# Patient Record
Sex: Female | Born: 1980 | Race: Black or African American | Hispanic: No | Marital: Single | Smoking: Current every day smoker
Health system: Southern US, Community
[De-identification: ages and names within clinical notes are randomized; demographics above are authoritative.]

## PROBLEM LIST (undated history)

## (undated) DIAGNOSIS — F419 Anxiety disorder, unspecified: Secondary | ICD-10-CM

## (undated) DIAGNOSIS — M549 Dorsalgia, unspecified: Secondary | ICD-10-CM

## (undated) DIAGNOSIS — G8929 Other chronic pain: Secondary | ICD-10-CM

## (undated) HISTORY — PX: WISDOM TOOTH EXTRACTION: SHX21

---

## 2004-10-23 ENCOUNTER — Emergency Department (HOSPITAL_COMMUNITY): Admission: EM | Admit: 2004-10-23 | Discharge: 2004-10-23 | Payer: Self-pay | Admitting: Emergency Medicine

## 2007-05-07 ENCOUNTER — Emergency Department (HOSPITAL_COMMUNITY): Admission: EM | Admit: 2007-05-07 | Discharge: 2007-05-07 | Payer: Self-pay | Admitting: Emergency Medicine

## 2007-11-15 ENCOUNTER — Emergency Department (HOSPITAL_COMMUNITY): Admission: EM | Admit: 2007-11-15 | Discharge: 2007-11-15 | Payer: Self-pay | Admitting: Emergency Medicine

## 2008-03-06 ENCOUNTER — Emergency Department (HOSPITAL_BASED_OUTPATIENT_CLINIC_OR_DEPARTMENT_OTHER): Admission: EM | Admit: 2008-03-06 | Discharge: 2008-03-06 | Payer: Self-pay | Admitting: Emergency Medicine

## 2008-03-13 ENCOUNTER — Inpatient Hospital Stay (HOSPITAL_COMMUNITY): Admission: AD | Admit: 2008-03-13 | Discharge: 2008-03-14 | Payer: Self-pay | Admitting: Obstetrics and Gynecology

## 2009-06-30 ENCOUNTER — Emergency Department (HOSPITAL_COMMUNITY): Admission: EM | Admit: 2009-06-30 | Discharge: 2009-07-01 | Payer: Self-pay | Admitting: Emergency Medicine

## 2009-10-11 ENCOUNTER — Emergency Department (HOSPITAL_BASED_OUTPATIENT_CLINIC_OR_DEPARTMENT_OTHER): Admission: EM | Admit: 2009-10-11 | Discharge: 2009-10-11 | Payer: Self-pay | Admitting: Emergency Medicine

## 2009-12-03 ENCOUNTER — Emergency Department (HOSPITAL_COMMUNITY): Admission: EM | Admit: 2009-12-03 | Discharge: 2009-12-03 | Payer: Self-pay | Admitting: Emergency Medicine

## 2010-05-18 ENCOUNTER — Emergency Department (HOSPITAL_COMMUNITY): Admission: EM | Admit: 2010-05-18 | Discharge: 2010-05-18 | Payer: Self-pay | Admitting: Emergency Medicine

## 2010-10-26 ENCOUNTER — Emergency Department (HOSPITAL_COMMUNITY)
Admission: EM | Admit: 2010-10-26 | Discharge: 2010-10-26 | Disposition: A | Discharge status: Home / Self Care | Payer: Self-pay | Attending: Emergency Medicine | Admitting: Emergency Medicine

## 2010-10-26 DIAGNOSIS — Y92009 Unspecified place in unspecified non-institutional (private) residence as the place of occurrence of the external cause: Secondary | ICD-10-CM | POA: Insufficient documentation

## 2010-10-26 DIAGNOSIS — M545 Low back pain, unspecified: Secondary | ICD-10-CM | POA: Insufficient documentation

## 2010-10-26 DIAGNOSIS — S335XXA Sprain of ligaments of lumbar spine, initial encounter: Secondary | ICD-10-CM | POA: Insufficient documentation

## 2010-10-26 DIAGNOSIS — X500XXA Overexertion from strenuous movement or load, initial encounter: Secondary | ICD-10-CM | POA: Insufficient documentation

## 2010-11-03 ENCOUNTER — Emergency Department (HOSPITAL_BASED_OUTPATIENT_CLINIC_OR_DEPARTMENT_OTHER)
Admission: EM | Admit: 2010-11-03 | Discharge: 2010-11-03 | Disposition: A | Discharge status: Home / Self Care | Payer: Self-pay | Attending: Emergency Medicine | Admitting: Emergency Medicine

## 2010-11-03 DIAGNOSIS — L02219 Cutaneous abscess of trunk, unspecified: Secondary | ICD-10-CM | POA: Insufficient documentation

## 2010-11-03 DIAGNOSIS — F17200 Nicotine dependence, unspecified, uncomplicated: Secondary | ICD-10-CM | POA: Insufficient documentation

## 2010-11-05 ENCOUNTER — Emergency Department (HOSPITAL_BASED_OUTPATIENT_CLINIC_OR_DEPARTMENT_OTHER)
Admission: EM | Admit: 2010-11-05 | Discharge: 2010-11-05 | Disposition: A | Discharge status: Home / Self Care | Payer: Self-pay | Attending: Emergency Medicine | Admitting: Emergency Medicine

## 2010-11-05 DIAGNOSIS — Z4800 Encounter for change or removal of nonsurgical wound dressing: Secondary | ICD-10-CM | POA: Insufficient documentation

## 2010-11-05 DIAGNOSIS — F17200 Nicotine dependence, unspecified, uncomplicated: Secondary | ICD-10-CM | POA: Insufficient documentation

## 2010-12-06 ENCOUNTER — Emergency Department (HOSPITAL_BASED_OUTPATIENT_CLINIC_OR_DEPARTMENT_OTHER)
Admission: EM | Admit: 2010-12-06 | Discharge: 2010-12-06 | Disposition: A | Discharge status: Home / Self Care | Payer: Self-pay | Attending: Emergency Medicine | Admitting: Emergency Medicine

## 2010-12-06 DIAGNOSIS — L03317 Cellulitis of buttock: Secondary | ICD-10-CM | POA: Insufficient documentation

## 2010-12-06 DIAGNOSIS — L0231 Cutaneous abscess of buttock: Secondary | ICD-10-CM | POA: Insufficient documentation

## 2010-12-06 DIAGNOSIS — F17200 Nicotine dependence, unspecified, uncomplicated: Secondary | ICD-10-CM | POA: Insufficient documentation

## 2010-12-08 LAB — WOUND CULTURE: Culture: NO GROWTH

## 2011-02-06 ENCOUNTER — Emergency Department (HOSPITAL_COMMUNITY)
Admission: EM | Admit: 2011-02-06 | Discharge: 2011-02-06 | Disposition: A | Discharge status: Home / Self Care | Payer: Self-pay | Attending: Emergency Medicine | Admitting: Emergency Medicine

## 2011-02-06 DIAGNOSIS — R51 Headache: Secondary | ICD-10-CM | POA: Insufficient documentation

## 2011-02-06 DIAGNOSIS — K1370 Unspecified lesions of oral mucosa: Secondary | ICD-10-CM | POA: Insufficient documentation

## 2011-04-09 ENCOUNTER — Emergency Department (HOSPITAL_COMMUNITY)
Admission: EM | Admit: 2011-04-09 | Discharge: 2011-04-09 | Disposition: A | Discharge status: Home / Self Care | Payer: Self-pay | Attending: Emergency Medicine | Admitting: Emergency Medicine

## 2011-04-09 DIAGNOSIS — N949 Unspecified condition associated with female genital organs and menstrual cycle: Secondary | ICD-10-CM | POA: Insufficient documentation

## 2011-04-09 DIAGNOSIS — R197 Diarrhea, unspecified: Secondary | ICD-10-CM | POA: Insufficient documentation

## 2011-04-09 DIAGNOSIS — N760 Acute vaginitis: Secondary | ICD-10-CM | POA: Insufficient documentation

## 2011-04-09 DIAGNOSIS — B9689 Other specified bacterial agents as the cause of diseases classified elsewhere: Secondary | ICD-10-CM | POA: Insufficient documentation

## 2011-04-09 DIAGNOSIS — A499 Bacterial infection, unspecified: Secondary | ICD-10-CM | POA: Insufficient documentation

## 2011-04-09 DIAGNOSIS — R000 Tachycardia, unspecified: Secondary | ICD-10-CM | POA: Insufficient documentation

## 2011-04-09 DIAGNOSIS — R350 Frequency of micturition: Secondary | ICD-10-CM | POA: Insufficient documentation

## 2011-04-09 DIAGNOSIS — K5900 Constipation, unspecified: Secondary | ICD-10-CM | POA: Insufficient documentation

## 2011-04-09 LAB — URINALYSIS, ROUTINE W REFLEX MICROSCOPIC
Glucose, UA: NEGATIVE mg/dL
Hgb urine dipstick: NEGATIVE
Protein, ur: NEGATIVE mg/dL
Specific Gravity, Urine: 1.017 (ref 1.005–1.030)

## 2011-04-09 LAB — WET PREP, GENITAL
Trich, Wet Prep: NONE SEEN
Yeast Wet Prep HPF POC: NONE SEEN

## 2011-04-09 LAB — URINE MICROSCOPIC-ADD ON

## 2011-04-10 LAB — GC/CHLAMYDIA PROBE AMP, GENITAL
Chlamydia, DNA Probe: NEGATIVE
GC Probe Amp, Genital: NEGATIVE

## 2011-05-07 LAB — URINALYSIS, ROUTINE W REFLEX MICROSCOPIC
Bilirubin Urine: NEGATIVE
Glucose, UA: NEGATIVE
Ketones, ur: NEGATIVE
Nitrite: NEGATIVE
Specific Gravity, Urine: 1.02
Specific Gravity, Urine: >1.03 — ABNORMAL HIGH
Urobilinogen, UA: 1
pH: 5.5

## 2011-05-07 LAB — WET PREP, GENITAL
Clue Cells Wet Prep HPF POC: NONE SEEN
Trich, Wet Prep: NONE SEEN
Trich, Wet Prep: NONE SEEN
Yeast Wet Prep HPF POC: NONE SEEN

## 2011-05-07 LAB — RPR: RPR Ser Ql: NONREACTIVE

## 2011-05-07 LAB — CBC
MCV: 91.8
Platelets: 266
WBC: 7.6

## 2011-05-07 LAB — GC/CHLAMYDIA PROBE AMP, GENITAL: Chlamydia, DNA Probe: POSITIVE — AB

## 2011-05-07 LAB — URINE MICROSCOPIC-ADD ON

## 2011-05-07 LAB — URINE CULTURE: Culture: NO GROWTH

## 2011-05-07 LAB — PREGNANCY, URINE: Preg Test, Ur: NEGATIVE

## 2011-11-17 ENCOUNTER — Emergency Department (HOSPITAL_BASED_OUTPATIENT_CLINIC_OR_DEPARTMENT_OTHER)
Admission: EM | Admit: 2011-11-17 | Discharge: 2011-11-17 | Disposition: A | Discharge status: Home Health | Payer: Self-pay | Attending: Emergency Medicine | Admitting: Emergency Medicine

## 2011-11-17 ENCOUNTER — Emergency Department (INDEPENDENT_AMBULATORY_CARE_PROVIDER_SITE_OTHER): Payer: Self-pay

## 2011-11-17 ENCOUNTER — Encounter (HOSPITAL_BASED_OUTPATIENT_CLINIC_OR_DEPARTMENT_OTHER): Payer: Self-pay | Admitting: Emergency Medicine

## 2011-11-17 DIAGNOSIS — M25569 Pain in unspecified knee: Secondary | ICD-10-CM

## 2011-11-17 MED ORDER — IBUPROFEN 800 MG PO TABS
800.0000 mg | ORAL_TABLET | Freq: Once | ORAL | Status: AC
  Administered 2011-11-17: 800 mg via ORAL
  Filled 2011-11-17: qty 1

## 2011-11-17 MED ORDER — HYDROCODONE-ACETAMINOPHEN 5-500 MG PO TABS: 1.0000 | ORAL_TABLET | Freq: Four times a day (QID) | ORAL | Status: AC | PRN

## 2011-11-17 MED ORDER — NAPROXEN 500 MG PO TABS: 500.0000 mg | ORAL_TABLET | Freq: Two times a day (BID) | ORAL | Status: DC

## 2011-11-17 NOTE — ED Notes (Signed)
MD at bedside. 

## 2011-11-17 NOTE — ED Provider Notes (Signed)
History     CSN: [id number]  Arrival date & time 11/17/11  1943   First MD Initiated Contact with Patient 11/17/11 2052      Chief Complaint  Patient presents with  . Knee Pain    (Consider location/radiation/quality/duration/timing/severity/associated sxs/prior treatment) Patient is a 31 y.o. female presenting with knee pain. The history is provided by the patient. No language interpreter was used.  Knee Pain This is a new problem. The current episode started 2 days ago. The problem occurs constantly. The problem has been gradually worsening. Pertinent negatives include no chest pain, no abdominal pain, no headaches and no shortness of breath. The symptoms are aggravated by walking, bending and standing. The symptoms are relieved by nothing. She has tried nothing for the symptoms. The treatment provided no relief.    History reviewed. No pertinent past medical history.  History reviewed. No pertinent past surgical history.  History reviewed. No pertinent family history.  History  Substance Use Topics  . Smoking status: Current Everyday Smoker -- 1.0 packs/day  . Smokeless tobacco: Not on file  . Alcohol Use: Yes    OB History    Grav Para Term Preterm Abortions TAB SAB Ect Mult Living                  Review of Systems  Constitutional: Negative for fever, activity change, appetite change and fatigue.  HENT: Negative for congestion, sore throat, rhinorrhea, neck pain and neck stiffness.   Respiratory: Negative for cough and shortness of breath.   Cardiovascular: Negative for chest pain and palpitations.  Gastrointestinal: Negative for nausea, vomiting and abdominal pain.  Genitourinary: Negative for dysuria, urgency, frequency and flank pain.  Musculoskeletal: Positive for arthralgias. Negative for myalgias and back pain.  Neurological: Negative for dizziness, weakness, light-headedness, numbness and headaches.  All other systems reviewed and are  negative.    Allergies  Review of patient's allergies indicates no known allergies.  Home Medications   Current Outpatient Rx  Name Route Sig Dispense Refill  . IBUPROFEN 200 MG PO TABS Oral Take 400 mg by mouth every 6 (six) hours as needed. Patient used this medication for her knee pain.    Marland Kitchen HYDROCODONE-ACETAMINOPHEN 5-500 MG PO TABS Oral Take 1-2 tablets by mouth every 6 (six) hours as needed for pain. 15 tablet 0  . NAPROXEN 500 MG PO TABS Oral Take 1 tablet (500 mg total) by mouth 2 (two) times daily. 30 tablet 0    BP 126/76  Pulse 99  Temp 98 F (36.7 C)  Resp 16  Ht 5\' 4"  (1.626 m)  Wt 180 lb (81.647 kg)  BMI 30.90 kg/m2  SpO2 100%  LMP 11/11/2011  Physical Exam  Nursing note and vitals reviewed. Constitutional: She is oriented to person, place, and time. She appears well-developed and well-nourished. No distress.  HENT:  Head: Normocephalic and atraumatic.  Mouth/Throat: Oropharynx is clear and moist.  Eyes: Conjunctivae and EOM are normal. Pupils are equal, round, and reactive to light.  Neck: Normal range of motion. Neck supple.  Cardiovascular: Normal rate, regular rhythm, normal heart sounds and intact distal pulses.  Exam reveals no gallop and no friction rub.   No murmur heard. Pulmonary/Chest: Effort normal and breath sounds normal. No respiratory distress. She exhibits no tenderness.  Abdominal: Soft. Bowel sounds are normal. There is no tenderness.  Musculoskeletal:       Right knee: She exhibits no swelling, no effusion, no ecchymosis, no deformity and no LCL laxity.  tenderness found. Lateral joint line tenderness noted. No medial joint line and no MCL tenderness noted.  Neurological: She is alert and oriented to person, place, and time. No cranial nerve deficit.  Skin: Skin is warm and dry. No rash noted.    ED Course  Procedures (including critical care time)  Labs Reviewed - No data to display Dg Knee Complete 4 Views Right  11/17/2011   *RADIOLOGY REPORT*  Clinical Data: Knee pain.  RIGHT KNEE - COMPLETE 4+ VIEW  Comparison: None.  Findings: The knee is located.  No acute fracture or traumatic subluxation is evident.  There is no significant effusion.  IMPRESSION: Negative right knee.  Original Report Authenticated By: Jamesetta Orleans. MATTERN, M.D.     1. Knee pain       MDM  No evidence of bony injury. Pain is lateral therefore concerned about meniscus or lateral soft tissue injury. She'll be instructed to followup with sports medicine in one week. Placed in a knee immobilizer. Provided strict signs and symptoms for which to return        Dayton Bailiff, MD 11/17/11 2146

## 2011-11-17 NOTE — Discharge Instructions (Signed)
Knee Pain The knee is the complex joint between your thigh and your lower leg. It is made up of bones, tendons, ligaments, and cartilage. The bones that make up the knee are:  The femur in the thigh.   The tibia and fibula in the lower leg.   The patella or kneecap riding in the groove on the lower femur.  CAUSES  Knee pain is a common complaint with many causes. A few of these causes are:  Injury, such as:   A ruptured ligament or tendon injury.   Torn cartilage.   Medical conditions, such as:   Gout   Arthritis   Infections   Overuse, over training or overdoing a physical activity.  Knee pain can be minor or severe. Knee pain can accompany debilitating injury. Minor knee problems often respond well to self-care measures or get well on their own. More serious injuries may need medical intervention or even surgery. SYMPTOMS The knee is complex. Symptoms of knee problems can vary widely. Some of the problems are:  Pain with movement and weight bearing.   Swelling and tenderness.   Buckling of the knee.   Inability to straighten or extend your knee.   Your knee locks and you cannot straighten it.   Warmth and redness with pain and fever.   Deformity or dislocation of the kneecap.  DIAGNOSIS  Determining what is wrong may be very straight forward such as when there is an injury. It can also be challenging because of the complexity of the knee. Tests to make a diagnosis may include:  Your caregiver taking a history and doing a physical exam.   Routine X-rays can be used to rule out other problems. X-rays will not reveal a cartilage tear. Some injuries of the knee can be diagnosed by:   Arthroscopy a surgical technique by which a small video camera is inserted through tiny incisions on the sides of the knee. This procedure is used to examine and repair internal knee joint problems. Tiny instruments can be used during arthroscopy to repair the torn knee cartilage  (meniscus).   Arthrography is a radiology technique. A contrast liquid is directly injected into the knee joint. Internal structures of the knee joint then become visible on X-ray film.   An MRI scan is a non x-ray radiology procedure in which magnetic fields and a computer produce two- or three-dimensional images of the inside of the knee. Cartilage tears are often visible using an MRI scanner. MRI scans have largely replaced arthrography in diagnosing cartilage tears of the knee.   Blood work.   Examination of the fluid that helps to lubricate the knee joint (synovial fluid). This is done by taking a sample out using a needle and a syringe.  TREATMENT The treatment of knee problems depends on the cause. Some of these treatments are:  Depending on the injury, proper casting, splinting, surgery or physical therapy care will be needed.   Give yourself adequate recovery time. Do not overuse your joints. If you begin to get sore during workout routines, back off. Slow down or do fewer repetitions.   For repetitive activities such as cycling or running, maintain your strength and nutrition.   Alternate muscle groups. For example if you are a weight lifter, work the upper body on one day and the lower body the next.   Either tight or weak muscles do not give the proper support for your knee. Tight or weak muscles do not absorb the stress placed   on the knee joint. Keep the muscles surrounding the knee strong.   Take care of mechanical problems.   If you have flat feet, orthotics or special shoes may help. See your caregiver if you need help.   Arch supports, sometimes with wedges on the inner or outer aspect of the heel, can help. These can shift pressure away from the side of the knee most bothered by osteoarthritis.   A brace called an "unloader" brace also may be used to help ease the pressure on the most arthritic side of the knee.   If your caregiver has prescribed crutches, braces,  wraps or ice, use as directed. The acronym for this is PRICE. This means protection, rest, ice, compression and elevation.   Nonsteroidal anti-inflammatory drugs (NSAID's), can help relieve pain. But if taken immediately after an injury, they may actually increase swelling. Take NSAID's with food in your stomach. Stop them if you develop stomach problems. Do not take these if you have a history of ulcers, stomach pain or bleeding from the bowel. Do not take without your caregiver's approval if you have problems with fluid retention, heart failure, or kidney problems.   For ongoing knee problems, physical therapy may be helpful.   Glucosamine and chondroitin are over-the-counter dietary supplements. Both may help relieve the pain of osteoarthritis in the knee. These medicines are different from the usual anti-inflammatory drugs. Glucosamine may decrease the rate of cartilage destruction.   Injections of a corticosteroid drug into your knee joint may help reduce the symptoms of an arthritis flare-up. They may provide pain relief that lasts a few months. You may have to wait a few months between injections. The injections do have a small increased risk of infection, water retention and elevated blood sugar levels.   Hyaluronic acid injected into damaged joints may ease pain and provide lubrication. These injections may work by reducing inflammation. A series of shots may give relief for as long as 6 months.   Topical painkillers. Applying certain ointments to your skin may help relieve the pain and stiffness of osteoarthritis. Ask your pharmacist for suggestions. Many over the-counter products are approved for temporary relief of arthritis pain.   In some countries, doctors often prescribe topical NSAID's for relief of chronic conditions such as arthritis and tendinitis. A review of treatment with NSAID creams found that they worked as well as oral medications but without the serious side effects.    PREVENTION  Maintain a healthy weight. Extra pounds put more strain on your joints.   Get strong, stay limber. Weak muscles are a common cause of knee injuries. Stretching is important. Include flexibility exercises in your workouts.   Be smart about exercise. If you have osteoarthritis, chronic knee pain or recurring injuries, you may need to change the way you exercise. This does not mean you have to stop being active. If your knees ache after jogging or playing basketball, consider switching to swimming, water aerobics or other low-impact activities, at least for a few days a week. Sometimes limiting high-impact activities will provide relief.   Make sure your shoes fit well. Choose footwear that is right for your sport.   Protect your knees. Use the proper gear for knee-sensitive activities. Use kneepads when playing volleyball or laying carpet. Buckle your seat belt every time you drive. Most shattered kneecaps occur in car accidents.   Rest when you are tired.  SEEK MEDICAL CARE IF:  You have knee pain that is continual and does not   seem to be getting better.  SEEK IMMEDIATE MEDICAL CARE IF:  Your knee joint feels hot to the touch and you have a high fever. MAKE SURE YOU:   Understand these instructions.   Will watch your condition.   Will get help right away if you are not doing well or get worse.  Document Released: 05/23/2007 Document Revised: 07/15/2011 Document Reviewed: 05/23/2007 [employer] Patient Information 2012 [employer]. 

## 2011-11-17 NOTE — ED Notes (Signed)
Pt c/o right knee pain x 2 days no injury

## 2012-10-16 ENCOUNTER — Emergency Department (HOSPITAL_BASED_OUTPATIENT_CLINIC_OR_DEPARTMENT_OTHER)
Admission: EM | Admit: 2012-10-16 | Discharge: 2012-10-16 | Disposition: A | Discharge status: Home / Self Care | Payer: 59 | Attending: Emergency Medicine | Admitting: Emergency Medicine

## 2012-10-16 ENCOUNTER — Encounter (HOSPITAL_BASED_OUTPATIENT_CLINIC_OR_DEPARTMENT_OTHER): Payer: Self-pay

## 2012-10-16 DIAGNOSIS — R6889 Other general symptoms and signs: Secondary | ICD-10-CM | POA: Insufficient documentation

## 2012-10-16 DIAGNOSIS — J029 Acute pharyngitis, unspecified: Secondary | ICD-10-CM | POA: Insufficient documentation

## 2012-10-16 DIAGNOSIS — F17200 Nicotine dependence, unspecified, uncomplicated: Secondary | ICD-10-CM | POA: Insufficient documentation

## 2012-10-16 DIAGNOSIS — B9789 Other viral agents as the cause of diseases classified elsewhere: Secondary | ICD-10-CM | POA: Insufficient documentation

## 2012-10-16 DIAGNOSIS — J3489 Other specified disorders of nose and nasal sinuses: Secondary | ICD-10-CM | POA: Insufficient documentation

## 2012-10-16 DIAGNOSIS — H938X9 Other specified disorders of ear, unspecified ear: Secondary | ICD-10-CM | POA: Insufficient documentation

## 2012-10-16 DIAGNOSIS — J069 Acute upper respiratory infection, unspecified: Secondary | ICD-10-CM | POA: Insufficient documentation

## 2012-10-16 NOTE — ED Provider Notes (Signed)
History     CSN: [id number]  Arrival date & time 10/16/12  4098   First MD Initiated Contact with Patient 10/16/12 0710      Chief Complaint  Patient presents with  . Sore Throat    (Consider location/radiation/quality/duration/timing/severity/associated sxs/prior treatment) Patient is a 32 y.o. female presenting with pharyngitis. The history is provided by the patient.  Sore Throat Pertinent negatives include no abdominal pain, no headaches and no shortness of breath.  pt c/o mildly sore/scratchy throat for past 1-2 days, nasal congestion, sneezing, right ear fluid sensation. No trouble breathing or swallowing. No unilateral throat pain or swelling. No cough. No headache or rash. No fever or chills.    History reviewed. No pertinent past medical history.  History reviewed. No pertinent past surgical history.  No family history on file.  History  Substance Use Topics  . Smoking status: Current Every Day Smoker -- 1.00 packs/day  . Smokeless tobacco: Not on file  . Alcohol Use: Yes    OB History   Grav Para Term Preterm Abortions TAB SAB Ect Mult Living                  Review of Systems  Constitutional: Negative for fever and chills.  HENT: Positive for congestion.   Eyes: Negative for discharge and redness.  Respiratory: Negative for cough and shortness of breath.   Gastrointestinal: Negative for vomiting, abdominal pain and diarrhea.  Neurological: Negative for headaches.    Allergies  Review of patient's allergies indicates no known allergies.  Home Medications  No current outpatient prescriptions on file.  BP 141/93  Pulse 88  Temp(Src) 98.6 F (37 C) (Oral)  Resp 20  SpO2 100%  Physical Exam  Nursing note and vitals reviewed. Constitutional: She appears well-developed and well-nourished. No distress.  HENT:  Pharynx w mild erythema. No asymmetric swelling or exudate. No trismus. Clear fluid behind right tm.   Eyes: Conjunctivae are normal. No  scleral icterus.  Neck: Neck supple. No tracheal deviation present.  No stiffness or rigidity  Cardiovascular: Normal rate, regular rhythm, normal heart sounds and intact distal pulses.  Exam reveals no gallop and no friction rub.   No murmur heard. Pulmonary/Chest: Effort normal and breath sounds normal. No respiratory distress.  Abdominal: Soft. Normal appearance and bowel sounds are normal. She exhibits no distension. There is no tenderness.  No hsm.  Musculoskeletal: She exhibits no edema.  Lymphadenopathy:    She has no cervical adenopathy.  Neurological: She is alert.  Skin: Skin is warm and dry. No rash noted.  Psychiatric: She has a normal mood and affect.    ED Course  Procedures (including critical care time)   Results for orders placed during the hospital encounter of 10/16/12  RAPID STREP SCREEN      Result Value Range   Streptococcus, Group A Screen (Direct) NEGATIVE  NEGATIVE      1. URI (upper respiratory infection)   2. Viral pharyngitis       MDM  Strep negative. Discussed  Results w pt.  Exam c/w viral pharyngitis/uri.  Discussed otc meds for symptom relief.          Suzi Roots, MD 10/16/12 [phone]

## 2012-10-16 NOTE — ED Notes (Signed)
MD with pt  

## 2012-10-16 NOTE — ED Notes (Signed)
Patient here with sore throat x 1 day with right ear pain/fullness. Reports nasal congestion for same. Daughter being treated for strept

## 2013-03-10 ENCOUNTER — Emergency Department (HOSPITAL_BASED_OUTPATIENT_CLINIC_OR_DEPARTMENT_OTHER)
Admission: EM | Admit: 2013-03-10 | Discharge: 2013-03-10 | Disposition: A | Discharge status: Home / Self Care | Payer: Self-pay | Attending: Emergency Medicine | Admitting: Emergency Medicine

## 2013-03-10 ENCOUNTER — Encounter (HOSPITAL_BASED_OUTPATIENT_CLINIC_OR_DEPARTMENT_OTHER): Payer: Self-pay | Admitting: Emergency Medicine

## 2013-03-10 DIAGNOSIS — N764 Abscess of vulva: Secondary | ICD-10-CM | POA: Insufficient documentation

## 2013-03-10 DIAGNOSIS — F17200 Nicotine dependence, unspecified, uncomplicated: Secondary | ICD-10-CM | POA: Insufficient documentation

## 2013-03-10 MED ORDER — OXYCODONE-ACETAMINOPHEN 5-325 MG PO TABS
2.0000 | ORAL_TABLET | Freq: Once | ORAL | Status: AC
  Administered 2013-03-10: 2 via ORAL
  Filled 2013-03-10 (×2): qty 2

## 2013-03-10 MED ORDER — OXYCODONE-ACETAMINOPHEN 5-325 MG PO TABS: 1.0000 | ORAL_TABLET | ORAL | Status: DC | PRN

## 2013-03-10 NOTE — ED Notes (Signed)
Pt c/o abscess to vaginal region onset yesterday. Sts increased pain and drainage. Reports hx of same with menstrual cycle.

## 2013-03-10 NOTE — ED Provider Notes (Signed)
  CSN: [id number]     Arrival date & time 03/10/13  0746 History     First MD Initiated Contact with Patient 03/10/13 [phone]     Chief Complaint  Patient presents with  . Abscess    Patient is a 32 y.o. female presenting with abscess. The history is provided by the patient.  Abscess Location:  Ano-genital Ano-genital abscess location:  Vulva Abscess quality: induration   Duration:  1 day Progression:  Worsening Chronicity:  New Context: not diabetes   Relieved by:  Nothing Worsened by:  Nothing tried Associated symptoms: no fever and no vomiting      PMH  - none  History  Substance Use Topics  . Smoking status: Current Every Day Smoker -- 1.00 packs/day  . Smokeless tobacco: Not on file  . Alcohol Use: Yes   OB History   Grav Para Term Preterm Abortions TAB SAB Ect Mult Living                 Review of Systems  Constitutional: Negative for fever.  Gastrointestinal: Negative for vomiting.    Allergies  Review of patient's allergies indicates no known allergies.  Home Medications  No current outpatient prescriptions on file. BP 127/67  Pulse 93  Temp(Src) 98.3 F (36.8 C) (Oral)  Resp 18  Ht 5\' 3"  (1.6 m)  Wt 190 lb (86.183 kg)  BMI 33.67 kg/m2  SpO2 98%  LMP 03/09/2013 Physical Exam CONSTITUTIONAL: Well developed/well nourished HEAD: Normocephalic/atraumatic EYES: EOMI ENMT: Mucous membranes moist NECK: supple no meningeal signs CV: S1/S2 noted, no murmurs/rubs/gallops noted LUNGS: Lungs are clear to auscultation bilaterally, no apparent distress ABDOMEN: soft, nontender, no rebound or guarding GU:no cva tenderness. Left labial abscess noted with erythema/induration.  There is no fistula noted to inner vagina.  Chaperone present NEURO: Pt is awake/alert, moves all extremitiesx4 EXTREMITIES: full ROM SKIN: warm, color normal PSYCH: no abnormalities of mood noted  ED Course   Procedures  INCISION AND DRAINAGE Performed by: Joya Gaskins Consent: Verbal consent obtained. Risks and benefits: risks, benefits and alternatives were discussed Type: abscess  Body area: left labia  Anesthesia: local infiltration  Incision was made with a scalpel.  Local anesthetic: lidocaine % with epinephrine  Anesthetic total: 5 ml  Complexity: complex Blunt dissection to break up loculations  Drainage: purulent  Drainage amount: moderate  Patient tolerance: Patient tolerated the procedure well with no immediate complications.    8:58 AM Pt improved after abscess drainage.  The erythema has improved and size of abscess has improved.   There is no erythema/induration into perineum Will defer antibiotics Discussed strict return precautions  MDM  Nursing notes including past medical history and social history reviewed and considered in documentation Labs/vital reviewed and considered   Joya Gaskins, MD 03/10/13 [phone]

## 2013-12-24 ENCOUNTER — Emergency Department (HOSPITAL_BASED_OUTPATIENT_CLINIC_OR_DEPARTMENT_OTHER): Payer: 59

## 2013-12-24 ENCOUNTER — Encounter (HOSPITAL_BASED_OUTPATIENT_CLINIC_OR_DEPARTMENT_OTHER): Payer: Self-pay | Admitting: Emergency Medicine

## 2013-12-24 ENCOUNTER — Emergency Department (HOSPITAL_BASED_OUTPATIENT_CLINIC_OR_DEPARTMENT_OTHER)
Admission: EM | Admit: 2013-12-24 | Discharge: 2013-12-24 | Disposition: A | Discharge status: Home / Self Care | Payer: 59 | Attending: Emergency Medicine | Admitting: Emergency Medicine

## 2013-12-24 DIAGNOSIS — M25569 Pain in unspecified knee: Secondary | ICD-10-CM | POA: Insufficient documentation

## 2013-12-24 DIAGNOSIS — F17200 Nicotine dependence, unspecified, uncomplicated: Secondary | ICD-10-CM | POA: Insufficient documentation

## 2013-12-24 DIAGNOSIS — M25561 Pain in right knee: Secondary | ICD-10-CM

## 2013-12-24 MED ORDER — MELOXICAM 15 MG PO TABS: 15.0000 mg | ORAL_TABLET | Freq: Every day | ORAL | Status: DC

## 2013-12-24 NOTE — Discharge Instructions (Signed)
Knee Pain Knee pain can be a result of an injury or other medical conditions. Treatment will depend on the cause of your pain. HOME CARE  Only take medicine as told by your doctor.  Keep a healthy weight. Being overweight can make the knee hurt more.  Stretch before exercising or playing sports.  If there is constant knee pain, change the way you exercise. Ask your doctor for advice.  Make sure shoes fit well. Choose the right shoe for the sport or activity.  Protect your knees. Wear kneepads if needed.  Rest when you are tired. GET HELP RIGHT AWAY IF:   Your knee pain does not stop.  Your knee pain does not get better.  Your knee joint feels hot to the touch.  You have a fever. MAKE SURE YOU:   Understand these instructions.  Will watch this condition.  Will get help right away if you are not doing well or get worse. Document Released: 10/22/2008 Document Revised: 10/18/2011 Document Reviewed: 10/22/2008 [employer] Patient Information 2014 [employer].  

## 2013-12-24 NOTE — ED Provider Notes (Signed)
CSN: [id number]     Arrival date & time 12/24/13  1313 History   First MD Initiated Contact with Patient 12/24/13 1334     Chief Complaint  Patient presents with  . Knee Pain     (Consider location/radiation/quality/duration/timing/severity/associated sxs/prior Treatment) Patient is a 33 y.o. female presenting with knee pain.  Knee Pain  Pt reports moderate to severe aching pain in R knee for 'a while' worsening over the last several days. Spends a lot of her time at work on her feet, but has not had a fall or injury. She is able to walk but has pain with movement. Has not been to doctor for same.   History reviewed. No pertinent past medical history. History reviewed. No pertinent past surgical history. History reviewed. No pertinent family history. History  Substance Use Topics  . Smoking status: Current Every Day Smoker -- 1.00 packs/day  . Smokeless tobacco: Not on file  . Alcohol Use: Yes   OB History   Grav Para Term Preterm Abortions TAB SAB Ect Mult Living                 Review of Systems  All other systems reviewed and are negative except as noted in HPI.    Allergies  Review of patient's allergies indicates no known allergies.  Home Medications   Prior to Admission medications   Medication Sig Start Date End Date Taking? Authorizing Provider  acetaminophen (TYLENOL) 325 MG tablet Take 1,000 mg by mouth every 6 (six) hours as needed.   Yes Historical Provider, MD  oxyCODONE-acetaminophen (PERCOCET/ROXICET) 5-325 MG per tablet Take 1 tablet by mouth every 4 (four) hours as needed for pain. 03/10/13   Joya Gaskinsonald W Wickline, MD   BP 131/76  Pulse 98  Temp(Src) 98.1 F (36.7 C) (Oral)  Resp 16  Ht 5\' 2"  (1.575 m)  Wt 190 lb (86.183 kg)  BMI 34.74 kg/m2  SpO2 100%  LMP 12/19/2013 Physical Exam  Constitutional: She is oriented to person, place, and time. She appears well-developed and well-nourished.  HENT:  Head: Normocephalic and atraumatic.  Neck: Neck  supple.  Pulmonary/Chest: Effort normal.  Musculoskeletal: Normal range of motion. She exhibits tenderness (diffuse mild tenderness R knee, no deformity, no significant effusion or signs of infection).  Neurological: She is alert and oriented to person, place, and time. No cranial nerve deficit.  Psychiatric: She has a normal mood and affect. Her behavior is normal.    ED Course  Procedures (including critical care time) Labs Review Labs Reviewed - No data to display  Imaging Review Dg Knee Complete 4 Views Right  12/24/2013   CLINICAL DATA:  Pain and swelling  EXAM: RIGHT KNEE - COMPLETE 4+ VIEW  COMPARISON:  November 17, 2011  FINDINGS: Frontal, lateral, bilateral oblique views were obtained. There is no fracture, dislocation, or effusion. Joint spaces appear intact. No erosive change.  IMPRESSION: No abnormality noted.   Electronically Signed   By: Bretta BangWilliam  Woodruff M.D.   On: 12/24/2013 13:45     EKG Interpretation None      MDM   Final diagnoses:  Right knee pain   Xray neg, no concern for septic joint, knee sleeve and referral to Dr. Pearletha ForgeHudnall for further eval and treatment.     Charles B. Bernette MayersSheldon, MD 12/24/13 [phone]

## 2013-12-24 NOTE — ED Notes (Signed)
Pt c/o right knee  pain x 12 hrs  Denies injury 

## 2016-07-23 ENCOUNTER — Emergency Department (HOSPITAL_BASED_OUTPATIENT_CLINIC_OR_DEPARTMENT_OTHER)
Admission: EM | Admit: 2016-07-23 | Discharge: 2016-07-23 | Disposition: A | Discharge status: Home / Self Care | Payer: Self-pay | Attending: Emergency Medicine | Admitting: Emergency Medicine

## 2016-07-23 ENCOUNTER — Encounter (HOSPITAL_BASED_OUTPATIENT_CLINIC_OR_DEPARTMENT_OTHER): Payer: Self-pay | Admitting: *Deleted

## 2016-07-23 DIAGNOSIS — F17200 Nicotine dependence, unspecified, uncomplicated: Secondary | ICD-10-CM | POA: Insufficient documentation

## 2016-07-23 DIAGNOSIS — Z202 Contact with and (suspected) exposure to infections with a predominantly sexual mode of transmission: Secondary | ICD-10-CM | POA: Insufficient documentation

## 2016-07-23 DIAGNOSIS — Z711 Person with feared health complaint in whom no diagnosis is made: Secondary | ICD-10-CM

## 2016-07-23 LAB — URINALYSIS, ROUTINE W REFLEX MICROSCOPIC
Glucose, UA: NEGATIVE mg/dL
Hgb urine dipstick: NEGATIVE
Ketones, ur: 15 mg/dL — AB
Leukocytes, UA: NEGATIVE
Nitrite: NEGATIVE
Protein, ur: NEGATIVE mg/dL
Specific Gravity, Urine: 1.036 — ABNORMAL HIGH (ref 1.005–1.030)
pH: 6 (ref 5.0–8.0)

## 2016-07-23 LAB — PREGNANCY, URINE: Preg Test, Ur: NEGATIVE

## 2016-07-23 MED ORDER — AZITHROMYCIN 250 MG PO TABS
1000.0000 mg | ORAL_TABLET | Freq: Once | ORAL | Status: AC
  Administered 2016-07-23: 1000 mg via ORAL
  Filled 2016-07-23: qty 4

## 2016-07-23 MED ORDER — CEFTRIAXONE SODIUM 250 MG IJ SOLR
250.0000 mg | Freq: Once | INTRAMUSCULAR | Status: AC
  Administered 2016-07-23: 250 mg via INTRAMUSCULAR
  Filled 2016-07-23: qty 250

## 2016-07-23 NOTE — ED Triage Notes (Signed)
Std exposure. No symptoms.
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
## 2016-07-26 LAB — GC/CHLAMYDIA PROBE AMP (~~LOC~~) NOT AT ARMC
Chlamydia: NEGATIVE
Neisseria Gonorrhea: NEGATIVE

## 2016-11-14 ENCOUNTER — Emergency Department (HOSPITAL_BASED_OUTPATIENT_CLINIC_OR_DEPARTMENT_OTHER)
Admission: EM | Admit: 2016-11-14 | Discharge: 2016-11-14 | Disposition: A | Discharge status: Home / Self Care | Payer: Self-pay | Attending: Emergency Medicine | Admitting: Emergency Medicine

## 2016-11-14 ENCOUNTER — Encounter (HOSPITAL_BASED_OUTPATIENT_CLINIC_OR_DEPARTMENT_OTHER): Payer: Self-pay | Admitting: *Deleted

## 2016-11-14 DIAGNOSIS — N3000 Acute cystitis without hematuria: Secondary | ICD-10-CM | POA: Insufficient documentation

## 2016-11-14 DIAGNOSIS — F17200 Nicotine dependence, unspecified, uncomplicated: Secondary | ICD-10-CM | POA: Insufficient documentation

## 2016-11-14 LAB — URINALYSIS, MICROSCOPIC (REFLEX)

## 2016-11-14 LAB — URINALYSIS, ROUTINE W REFLEX MICROSCOPIC
BILIRUBIN URINE: NEGATIVE
Glucose, UA: NEGATIVE mg/dL
Ketones, ur: 15 mg/dL — AB
Nitrite: POSITIVE — AB
PH: 5.5 (ref 5.0–8.0)
Protein, ur: NEGATIVE mg/dL
SPECIFIC GRAVITY, URINE: 1.028 (ref 1.005–1.030)

## 2016-11-14 LAB — PREGNANCY, URINE: Preg Test, Ur: NEGATIVE

## 2016-11-14 MED ORDER — PHENAZOPYRIDINE HCL 200 MG PO TABS: 200.0000 mg | ORAL_TABLET | Freq: Three times a day (TID) | ORAL | 0 refills | Status: DC

## 2016-11-14 MED ORDER — CEPHALEXIN 500 MG PO CAPS: 500.0000 mg | ORAL_CAPSULE | Freq: Two times a day (BID) | ORAL | 0 refills | Status: AC

## 2016-11-14 NOTE — ED Notes (Signed)
Pt given d/c instructions as per chart. Rx x 2. Verbalizes understanding. No questions. 

## 2016-11-14 NOTE — ED Triage Notes (Signed)
Pt reports dysuria, frequency x 4days. Denies fever, hematuria, v/d. Reports transient nausea.

## 2016-11-14 NOTE — Discharge Instructions (Signed)
Please take Keflex 2 times daily for 5 days. Please take Pyridium as needed urinary symptoms. Please drink at least 8 glasses of water throughout the day. Please follow-up with your primary care provider in one week as needed.  Contact a health care provider if: You have back pain. You have a fever. You feel nauseous or vomit. Your symptoms do not get better after 3 days. Your symptoms go away and then return. Get help right away if: You have severe back pain or lower abdominal pain. You are vomiting and cannot keep down any medicines or water.

## 2016-11-14 NOTE — ED Provider Notes (Signed)
[facility] Note   CSN: [id number] Arrival date & time: 11/14/16  1819 By signing my name below, I, Bridgette Habermann, attest that this documentation has been prepared under the direction and in the presence of Francisco Espina, [place]. Electronically Signed: Bridgette Habermann, ED Scribe. 11/14/16. 8:34 PM.  History   Chief Complaint Chief Complaint  Patient presents with  . Dysuria    HPI The history is provided by the patient. No language interpreter was used.   HPI Comments: Alexandra Johnson is a 36 y.o. female with no pertinent PMHx, who presents to the Emergency Department complaining of gradually worsening dysuria with urinary frequency and nausea beginning 4 days ago. She has not tried any OTC medications PTA. Pt has h/o UTIs and states her symptoms at this time feel similar. LMP was 11/07/16. Pt denies fever, chills, hematuria, abdominal pain, vomiting, diarrhea, or any other associated symptoms.  Past Medical History:  Diagnosis Date  . Arthritis     There are no active problems to display for this patient.   History reviewed. No pertinent surgical history.  OB History    No data available       Home Medications    Prior to Admission medications   Medication Sig Start Date End Date Taking? Authorizing Provider  ALPRAZolam Prudy Feeler) 1 MG tablet Take 1 mg by mouth 3 (three) times daily as needed for anxiety.   Yes Historical Provider, MD  oxyCODONE-acetaminophen (PERCOCET/ROXICET) 5-325 MG per tablet Take 1 tablet by mouth every 4 (four) hours as needed for pain. 03/10/13  Yes Zadie Rhine, MD  acetaminophen (TYLENOL) 325 MG tablet Take 1,000 mg by mouth every 6 (six) hours as needed.    Historical Provider, MD  cephALEXin (KEFLEX) 500 MG capsule Take 1 capsule (500 mg total) by mouth 2 (two) times daily. 11/14/16 11/19/16  Francisco Manuel Espina, [place]  meloxicam (MOBIC) 15 MG tablet Take 1 tablet (15 mg total) by mouth daily. 12/24/13   Susy Frizzle, MD    phenazopyridine (PYRIDIUM) 200 MG tablet Take 1 tablet (200 mg total) by mouth 3 (three) times daily. 11/14/16   Francisco Orson Aloe, [place]    Family History No family history on file.  Social History Social History  Substance Use Topics  . Smoking status: Current Every Day Smoker    Packs/day: 1.00  . Smokeless tobacco: Never Used  . Alcohol use Yes     Comment: occ     Allergies   Patient has no known allergies.   Review of Systems Review of Systems  Constitutional: Negative for chills and fever.  Gastrointestinal: Positive for nausea. Negative for abdominal pain, diarrhea and vomiting.  Genitourinary: Positive for dysuria and frequency. Negative for hematuria.  All other systems reviewed and are negative.  Physical Exam Updated Vital Signs BP 133/88 (BP Location: Left Arm)   Pulse 90   Temp 98.4 F (36.9 C) (Oral)   Resp 18   Ht  (1.6 m)   Wt 81.6 kg   LMP 11/07/2016   SpO2 100%   BMI 31.89 kg/m   Physical Exam  Constitutional: She appears well-developed and well-nourished.  Well appearing  HENT:  Head: Normocephalic and atraumatic.  Nose: Nose normal.  Eyes: Conjunctivae and EOM are normal.  Neck: Normal range of motion.  Cardiovascular: Normal rate.   Pulmonary/Chest: Effort normal. No respiratory distress.  Normal work of breathing. No respiratory distress noted.   Abdominal: Soft. Bowel sounds are normal. There is no tenderness.  There is no rebound and no guarding.  Soft and nontender. No CVA tenderness. Negative Murphy sign. No focal tenderness at McBurney's point.  Musculoskeletal: Normal range of motion.  Neurological: She is alert.  Skin: Skin is warm.  Psychiatric: She has a normal mood and affect. Her behavior is normal.  Nursing note and vitals reviewed.  ED Treatments / Results  DIAGNOSTIC STUDIES: Oxygen Saturation is 100% on RA, normal by my interpretation.   COORDINATION OF CARE: 8:34 PM-Discussed next steps with pt. Pt  verbalized understanding and is agreeable with the plan.   Labs (all labs ordered are listed, but only abnormal results are displayed) Labs Reviewed  URINALYSIS, ROUTINE W REFLEX MICROSCOPIC - Abnormal; Notable for the following:       Result Value   APPearance CLOUDY (*)    Hgb urine dipstick SMALL (*)    Ketones, ur 15 (*)    Nitrite POSITIVE (*)    Leukocytes, UA LARGE (*)    All other components within normal limits  URINALYSIS, MICROSCOPIC (REFLEX) - Abnormal; Notable for the following:    Bacteria, UA FEW (*)    Squamous Epithelial / LPF 0-5 (*)    All other components within normal limits  URINE CULTURE  PREGNANCY, URINE    EKG  EKG Interpretation None       Radiology No results found.  Procedures Procedures (including critical care time)  Medications Ordered in ED Medications - No data to display   Initial Impression / Assessment and Plan / ED Course  I have reviewed the triage vital signs and the nursing notes.  Pertinent labs & imaging results that were available during my care of the patient were reviewed by me and considered in my medical decision making (see chart for details).     Pt diagnosed with a UTI. Pt is afebrile, tachycardia, hypotension, or other signs of serious infection.  Pt to be dc home with antibiotics and instructions to follow up with PCP if symptoms persist. Discussed return precautions. Pt appears safe for discharge.  Final Clinical Impressions(s) / ED Diagnoses   Final diagnoses:  Acute cystitis without hematuria    New Prescriptions New Prescriptions   CEPHALEXIN (KEFLEX) 500 MG CAPSULE    Take 1 capsule (500 mg total) by mouth 2 (two) times daily.   PHENAZOPYRIDINE (PYRIDIUM) 200 MG TABLET    Take 1 tablet (200 mg total) by mouth 3 (three) times daily.  . I personally performed the services described in this documentation, which was scribed in my presence. The recorded information has been reviewed and is accurate.      [address] [place], [place] 11/14/16 2057    Rolan Bucco, MD 11/14/16 2227

## 2016-11-17 LAB — URINE CULTURE: Culture: >=100000 — AB

## 2016-11-18 ENCOUNTER — Telehealth: Payer: Self-pay | Admitting: *Deleted

## 2016-11-18 NOTE — Telephone Encounter (Signed)
Post ED Visit - Positive Culture Follow-up  Culture report reviewed by antimicrobial stewardship pharmacist:   Enzo Bi, Pharm.D.  Celedonio Miyamoto, Pharm.D., BCPS AQ-ID  Garvin Fila, Pharm.D., BCPS  [place]ina Pillion, [address].D., BCPS  [place], [address].D., BCPS, AAHIVP  Estella Husk, Pharm.D., BCPS, AAHIVP  Lysle Pearl, PharmD, BCPS  Casilda Carls, PharmD, BCPS  Pollyann Samples, PharmD, BCPS  Positive urine culture Treated with cephalexin, organism sensitive to the same and no further patient follow-up is required at this time.  Virl Axe [facility] 11/18/2016, 11:15 AM

## 2017-04-29 ENCOUNTER — Emergency Department (HOSPITAL_BASED_OUTPATIENT_CLINIC_OR_DEPARTMENT_OTHER): Payer: No Typology Code available for payment source

## 2017-04-29 ENCOUNTER — Encounter (HOSPITAL_BASED_OUTPATIENT_CLINIC_OR_DEPARTMENT_OTHER): Payer: Self-pay

## 2017-04-29 ENCOUNTER — Emergency Department (HOSPITAL_BASED_OUTPATIENT_CLINIC_OR_DEPARTMENT_OTHER)
Admission: EM | Admit: 2017-04-29 | Discharge: 2017-04-29 | Disposition: A | Discharge status: Home / Self Care | Payer: No Typology Code available for payment source | Attending: Emergency Medicine | Admitting: Emergency Medicine

## 2017-04-29 DIAGNOSIS — Y929 Unspecified place or not applicable: Secondary | ICD-10-CM | POA: Insufficient documentation

## 2017-04-29 DIAGNOSIS — Z79899 Other long term (current) drug therapy: Secondary | ICD-10-CM | POA: Insufficient documentation

## 2017-04-29 DIAGNOSIS — N3000 Acute cystitis without hematuria: Secondary | ICD-10-CM | POA: Diagnosis not present

## 2017-04-29 DIAGNOSIS — F17200 Nicotine dependence, unspecified, uncomplicated: Secondary | ICD-10-CM | POA: Insufficient documentation

## 2017-04-29 DIAGNOSIS — M549 Dorsalgia, unspecified: Secondary | ICD-10-CM | POA: Diagnosis present

## 2017-04-29 DIAGNOSIS — Y999 Unspecified external cause status: Secondary | ICD-10-CM | POA: Insufficient documentation

## 2017-04-29 DIAGNOSIS — M542 Cervicalgia: Secondary | ICD-10-CM | POA: Insufficient documentation

## 2017-04-29 DIAGNOSIS — R079 Chest pain, unspecified: Secondary | ICD-10-CM | POA: Diagnosis not present

## 2017-04-29 DIAGNOSIS — A5901 Trichomonal vulvovaginitis: Secondary | ICD-10-CM | POA: Insufficient documentation

## 2017-04-29 DIAGNOSIS — Y939 Activity, unspecified: Secondary | ICD-10-CM | POA: Insufficient documentation

## 2017-04-29 LAB — URINALYSIS, ROUTINE W REFLEX MICROSCOPIC
Bilirubin Urine: NEGATIVE
GLUCOSE, UA: NEGATIVE mg/dL
Ketones, ur: 15 mg/dL — AB
Nitrite: POSITIVE — AB
PROTEIN: NEGATIVE mg/dL
SPECIFIC GRAVITY, URINE: 1.025 (ref 1.005–1.030)
pH: 6 (ref 5.0–8.0)

## 2017-04-29 LAB — CBC
HCT: 38.7 % (ref 36.0–46.0)
HEMOGLOBIN: 13.5 g/dL (ref 12.0–15.0)
MCH: 31.5 pg (ref 26.0–34.0)
MCHC: 34.9 g/dL (ref 30.0–36.0)
MCV: 90.2 fL (ref 78.0–100.0)
Platelets: 256 10*3/uL (ref 150–400)
RBC: 4.29 MIL/uL (ref 3.87–5.11)
RDW: 12.4 % (ref 11.5–15.5)
WBC: 6 10*3/uL (ref 4.0–10.5)

## 2017-04-29 LAB — COMPREHENSIVE METABOLIC PANEL
ALBUMIN: 4.3 g/dL (ref 3.5–5.0)
ALK PHOS: 69 U/L (ref 38–126)
ALT: 14 U/L (ref 14–54)
AST: 17 U/L (ref 15–41)
Anion gap: 8 (ref 5–15)
BILIRUBIN TOTAL: 1.2 mg/dL (ref 0.3–1.2)
BUN: 10 mg/dL (ref 6–20)
CALCIUM: 9.3 mg/dL (ref 8.9–10.3)
CO2: 23 mmol/L (ref 22–32)
Chloride: 105 mmol/L (ref 101–111)
Creatinine, Ser: 0.72 mg/dL (ref 0.44–1.00)
GFR calc Af Amer: >60 mL/min (ref >60)
GFR calc non Af Amer: >60 mL/min (ref >60)
GLUCOSE: 91 mg/dL (ref 65–99)
Potassium: 3.6 mmol/L (ref 3.5–5.1)
Sodium: 136 mmol/L (ref 135–145)
Total Protein: 7.7 g/dL (ref 6.5–8.1)

## 2017-04-29 LAB — PREGNANCY, URINE: Preg Test, Ur: NEGATIVE

## 2017-04-29 LAB — URINALYSIS, MICROSCOPIC (REFLEX)

## 2017-04-29 MED ORDER — MORPHINE SULFATE (PF) 4 MG/ML IV SOLN
4.0000 mg | Freq: Once | INTRAVENOUS | Status: AC
Start: 2017-04-29 — End: 2017-04-29
  Administered 2017-04-29: 4 mg via INTRAVENOUS
  Filled 2017-04-29: qty 1

## 2017-04-29 MED ORDER — ONDANSETRON HCL 4 MG/2ML IJ SOLN
4.0000 mg | Freq: Once | INTRAMUSCULAR | Status: AC
  Administered 2017-04-29: 4 mg via INTRAVENOUS
  Filled 2017-04-29: qty 2

## 2017-04-29 MED ORDER — CEPHALEXIN 500 MG PO CAPS: 500.0000 mg | ORAL_CAPSULE | Freq: Two times a day (BID) | ORAL | 0 refills | Status: DC

## 2017-04-29 MED ORDER — METRONIDAZOLE 500 MG PO TABS
2000.0000 mg | ORAL_TABLET | Freq: Once | ORAL | Status: AC
  Administered 2017-04-29: 2000 mg via ORAL
  Filled 2017-04-29: qty 4

## 2017-04-29 MED ORDER — IOPAMIDOL (ISOVUE-300) INJECTION 61%
100.0000 mL | Freq: Once | INTRAVENOUS | Status: AC | PRN
  Administered 2017-04-29: 100 mL via INTRAVENOUS

## 2017-04-29 NOTE — ED Notes (Signed)
No answer x3
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
## 2017-05-02 LAB — GC/CHLAMYDIA PROBE AMP (~~LOC~~) NOT AT ARMC
Chlamydia: NEGATIVE
NEISSERIA GONORRHEA: NEGATIVE

## 2018-02-12 ENCOUNTER — Emergency Department (HOSPITAL_BASED_OUTPATIENT_CLINIC_OR_DEPARTMENT_OTHER): Payer: BLUE CROSS/BLUE SHIELD

## 2018-02-12 ENCOUNTER — Other Ambulatory Visit: Payer: Self-pay

## 2018-02-12 ENCOUNTER — Encounter (HOSPITAL_BASED_OUTPATIENT_CLINIC_OR_DEPARTMENT_OTHER): Payer: Self-pay | Admitting: *Deleted

## 2018-02-12 ENCOUNTER — Emergency Department (HOSPITAL_BASED_OUTPATIENT_CLINIC_OR_DEPARTMENT_OTHER)
Admission: EM | Admit: 2018-02-12 | Discharge: 2018-02-12 | Disposition: A | Discharge status: Home / Self Care | Payer: BLUE CROSS/BLUE SHIELD | Attending: Emergency Medicine | Admitting: Emergency Medicine

## 2018-02-12 DIAGNOSIS — Y9389 Activity, other specified: Secondary | ICD-10-CM | POA: Diagnosis not present

## 2018-02-12 DIAGNOSIS — H1132 Conjunctival hemorrhage, left eye: Secondary | ICD-10-CM | POA: Diagnosis not present

## 2018-02-12 DIAGNOSIS — S0083XA Contusion of other part of head, initial encounter: Secondary | ICD-10-CM | POA: Diagnosis not present

## 2018-02-12 DIAGNOSIS — Y998 Other external cause status: Secondary | ICD-10-CM | POA: Diagnosis not present

## 2018-02-12 DIAGNOSIS — Y9289 Other specified places as the place of occurrence of the external cause: Secondary | ICD-10-CM | POA: Insufficient documentation

## 2018-02-12 DIAGNOSIS — F17210 Nicotine dependence, cigarettes, uncomplicated: Secondary | ICD-10-CM | POA: Diagnosis not present

## 2018-02-12 DIAGNOSIS — K0889 Other specified disorders of teeth and supporting structures: Secondary | ICD-10-CM

## 2018-02-12 DIAGNOSIS — R220 Localized swelling, mass and lump, head: Secondary | ICD-10-CM | POA: Diagnosis not present

## 2018-02-12 DIAGNOSIS — Z79899 Other long term (current) drug therapy: Secondary | ICD-10-CM | POA: Diagnosis not present

## 2018-02-12 DIAGNOSIS — S0990XA Unspecified injury of head, initial encounter: Secondary | ICD-10-CM | POA: Diagnosis present

## 2018-02-12 HISTORY — DX: Dorsalgia, unspecified: M54.9

## 2018-02-12 HISTORY — DX: Other chronic pain: G89.29

## 2018-02-12 HISTORY — DX: Anxiety disorder, unspecified: F41.9

## 2018-02-12 MED ORDER — TETRACAINE HCL 0.5 % OP SOLN
2.0000 [drp] | Freq: Once | OPHTHALMIC | Status: AC
  Administered 2018-02-12: 2 [drp] via OPHTHALMIC
  Filled 2018-02-12: qty 4

## 2018-02-12 MED ORDER — AMOXICILLIN-POT CLAVULANATE 875-125 MG PO TABS: 1.0000 | ORAL_TABLET | Freq: Two times a day (BID) | ORAL | 0 refills | Status: DC

## 2018-02-12 MED ORDER — HYDROCODONE-ACETAMINOPHEN 5-325 MG PO TABS: 2.0000 | ORAL_TABLET | Freq: Once | ORAL | Status: DC

## 2018-02-12 MED ORDER — MORPHINE SULFATE (PF) 4 MG/ML IV SOLN
4.0000 mg | Freq: Once | INTRAVENOUS | Status: AC
  Administered 2018-02-12: 4 mg via INTRAMUSCULAR
  Filled 2018-02-12: qty 1

## 2018-02-12 MED ORDER — FLUORESCEIN SODIUM 1 MG OP STRP
1.0000 | ORAL_STRIP | Freq: Once | OPHTHALMIC | Status: AC
  Administered 2018-02-12: 1 via OPHTHALMIC
  Filled 2018-02-12: qty 1

## 2018-02-12 MED ORDER — ACETAMINOPHEN 325 MG PO TABS
650.0000 mg | ORAL_TABLET | Freq: Once | ORAL | Status: DC
  Filled 2018-02-12: qty 2

## 2018-02-12 MED ORDER — OXYCODONE-ACETAMINOPHEN 5-325 MG PO TABS: 1.0000 | ORAL_TABLET | ORAL | 0 refills | Status: DC | PRN

## 2018-02-12 NOTE — ED Notes (Signed)
Pt to CT

## 2018-02-12 NOTE — ED Notes (Signed)
Return from CT. MD at bedside.

## 2018-02-12 NOTE — ED Notes (Signed)
Pain medication will be administered when pt has a ride home.

## 2018-02-12 NOTE — Discharge Instructions (Addendum)
Take motrin for pain.   Take percocet for severe pain.   You need to get further pain medicine prescriptions from your doctor.   Take augmentin twice daily for a week since there is some inflammation around your dental extraction site.   See your dentist for follow up   Return to ER if you have severe pain, vomiting, headaches, fevers.
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
## 2018-02-12 NOTE — ED Notes (Signed)
Pts ride at bedside
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
## 2018-02-12 NOTE — ED Notes (Addendum)
Ice pack given. Dried blood cleaned from pt's face

## 2018-03-20 ENCOUNTER — Other Ambulatory Visit: Payer: Self-pay

## 2018-03-20 ENCOUNTER — Encounter (HOSPITAL_BASED_OUTPATIENT_CLINIC_OR_DEPARTMENT_OTHER): Payer: Self-pay | Admitting: *Deleted

## 2018-03-20 ENCOUNTER — Emergency Department (HOSPITAL_BASED_OUTPATIENT_CLINIC_OR_DEPARTMENT_OTHER)
Admission: EM | Admit: 2018-03-20 | Discharge: 2018-03-20 | Disposition: A | Discharge status: Home / Self Care | Payer: BLUE CROSS/BLUE SHIELD | Attending: Emergency Medicine | Admitting: Emergency Medicine

## 2018-03-20 DIAGNOSIS — F17210 Nicotine dependence, cigarettes, uncomplicated: Secondary | ICD-10-CM | POA: Insufficient documentation

## 2018-03-20 DIAGNOSIS — K0889 Other specified disorders of teeth and supporting structures: Secondary | ICD-10-CM | POA: Diagnosis not present

## 2018-03-20 DIAGNOSIS — Z79899 Other long term (current) drug therapy: Secondary | ICD-10-CM | POA: Diagnosis not present

## 2018-03-20 MED ORDER — ACETAMINOPHEN-CODEINE #3 300-30 MG PO TABS: 1.0000 | ORAL_TABLET | Freq: Four times a day (QID) | ORAL | 0 refills | Status: DC | PRN

## 2018-03-20 NOTE — ED Notes (Signed)
ED Provider at bedside. 

## 2018-03-20 NOTE — ED Provider Notes (Signed)
[facility] EMERGENCY DEPARTMENT Provider Note   CSN: [id number] Arrival date & time: 03/20/18  1208     History   Chief Complaint Chief Complaint  Patient presents with  . Dental Pain    HPI Alexandra Johnson is a 37 y.o. female.  HPI   Alexandra Johnson is a 37 year old female with a history of anxiety who presents to the emergency department for evaluation of dental pain.  Patient reports that she has had pain in her right first bicuspid tooth for several months.  Reports that she has been seen by her dentist who told her to follow-up with maxillofacial surgeon for wisdom teeth removal.  She reports that despite having her wisdom teeth removed her pain has not improved.  She has been referred to an endodontist, although she reports that she does not have the money to pay for co-pay.  She states that pain is severe, sharp and constant.  Worsened with chewing.  She has been taking ibuprofen, Goody's powder and Orajel without significant improvement.  She denies fever, chills, neck pain or swelling, trouble breathing, trouble swallowing, trismus, facial swelling.  Past Medical History:  Diagnosis Date  . Anxiety   . Anxiety   . Chronic back pain     There are no active problems to display for this patient.   History reviewed. No pertinent surgical history.   OB History   None      Home Medications    Prior to Admission medications   Medication Sig Start Date End Date Taking? Authorizing Provider  acetaminophen (TYLENOL) 325 MG tablet Take 1,000 mg by mouth every 6 (six) hours as needed.    [provider]  ALPRAZolam Prudy Feeler(XANAX) 1 MG tablet Take 1 mg by mouth 3 (three) times daily as needed for anxiety.    [provider]  amoxicillin-clavulanate (AUGMENTIN) 875-125 MG tablet Take 1 tablet by mouth 2 (two) times daily. One po bid x 7 days 02/12/18   Charlynne PanderYao, David Hsienta, MD  cephALEXin (KEFLEX) 500 MG capsule Take 1 capsule (500 mg total) by mouth 2  (two) times daily. 04/29/17   Kellie ShropshireShrosbree, Chistian Kasler J, PA-C  meloxicam (MOBIC) 15 MG tablet Take 1 tablet (15 mg total) by mouth daily. 12/24/13   Susy FrizzleSheldon, Charles, MD  oxyCODONE-acetaminophen (PERCOCET/ROXICET) 5-325 MG per tablet Take 1 tablet by mouth every 4 (four) hours as needed for pain. 03/10/13   Zadie RhineWickline, Donald, MD  oxyCODONE-acetaminophen (PERCOCET/ROXICET) 5-325 MG tablet Take 1 tablet by mouth every 4 (four) hours as needed for severe pain. 02/12/18   Charlynne PanderYao, David Hsienta, MD  phenazopyridine (PYRIDIUM) 200 MG tablet Take 1 tablet (200 mg total) by mouth 3 (three) times daily. 11/14/16   Alvina ChouEspina, Francisco Manuel, PA    Family History No family history on file.  Social History Social History   Tobacco Use  . Smoking status: Current Every Day Smoker    Packs/day: 1.00  . Smokeless tobacco: Never Used  Substance Use Topics  . Alcohol use: Yes    Comment: occ  . Drug use: No     Allergies   Patient has no known allergies.   Review of Systems Review of Systems  Constitutional: Negative for chills and fever.  HENT: Positive for dental problem. Negative for facial swelling, sore throat and trouble swallowing.   Respiratory: Negative for shortness of breath.   Musculoskeletal: Negative for neck pain and neck stiffness.  Neurological: Negative for headaches.     Physical Exam Updated Vital Signs  BP (!) 135/94   Pulse 79   Temp 98.6 F (37 C) (Oral)   Resp 18   Ht 5\' 3"  (1.6 m)   Wt 77.1 kg   LMP 03/19/2018   SpO2 100%   BMI 30.11 kg/m   Physical Exam  Constitutional: She appears well-developed and well-nourished. No distress.  HENT:  Head: Normocephalic and atraumatic.  Mouth/Throat:    Pain along tooth as depicted in image. No crack or obvious cavity noted. No gingival erythema. No abscess. Midline uvula. No trismus. OP moist and clear. No oropharyngeal erythema or edema. Neck supple with no tenderness. No facial edema. No TMJ tenderness.   Eyes: Pupils are equal,  round, and reactive to light. Conjunctivae are normal. Right eye exhibits no discharge. Left eye exhibits no discharge.  Neck: Normal range of motion. Neck supple.  Pulmonary/Chest: Effort normal. No respiratory distress.  Lymphadenopathy:    She has no cervical adenopathy.  Neurological: She is alert. Abnormal reflex:  Coordination normal.  Skin: She is not diaphoretic.  Psychiatric: She has a normal mood and affect. Her behavior is normal.  Nursing note and vitals reviewed.    ED Treatments / Results  Labs (all labs ordered are listed, but only abnormal results are displayed) Labs Reviewed - No data to display  EKG None  Radiology No results found.  Procedures Procedures (including critical care time)  Medications Ordered in ED Medications - No data to display   Initial Impression / Assessment and Plan / ED Course  I have reviewed the triage vital signs and the nursing notes.  Pertinent labs & imaging results that were available during my care of the patient were reviewed by me and considered in my medical decision making (see chart for details).     Presents to the emergency department with chronic right upper bicuspid dental pain.  She has been evaluated by dentistry which has referred her to the endodontist.  No gross abscess.  No facial swelling.  No trismus.  Exam unconcerning for Ludwig's angina or spread of infection.  No TMJ tenderness.  Urged patient to follow-up with endodontist as scheduled.  Have counseled her on NSAID use and will prescribe Tylenol 3 for pain.  Discussed return precautions and she agrees and appears reliable.  Final Clinical Impressions(s) / ED Diagnoses   Final diagnoses:  Pain, dental    ED Discharge Orders         Ordered    acetaminophen-codeine (TYLENOL #3) 300-30 MG tablet  Every 6 hours PRN     03/20/18 1302           Kellie ShropshireShrosbree, Noah Pelaez J, PA-C 03/20/18 1302    Tegeler, Canary Brimhristopher J, MD 03/20/18 1404

## 2018-03-20 NOTE — ED Triage Notes (Signed)
Dental pain since last night

## 2018-03-20 NOTE — Discharge Instructions (Signed)
No signs of infection.  Please follow-up with the endodontist specialist.  I renew prescription for Tylenol 3 to help with your symptoms.  You can also take ibuprofen with this medicine.  Return to the ER if you have any new or concerning symptoms like fever, trouble breathing or swallowing, difficulty opening her mouth.

## 2018-06-01 ENCOUNTER — Encounter (HOSPITAL_BASED_OUTPATIENT_CLINIC_OR_DEPARTMENT_OTHER): Payer: Self-pay | Admitting: Emergency Medicine

## 2018-06-01 ENCOUNTER — Other Ambulatory Visit: Payer: Self-pay

## 2018-06-01 ENCOUNTER — Emergency Department (HOSPITAL_BASED_OUTPATIENT_CLINIC_OR_DEPARTMENT_OTHER)
Admission: EM | Admit: 2018-06-01 | Discharge: 2018-06-01 | Disposition: A | Discharge status: Home / Self Care | Payer: BLUE CROSS/BLUE SHIELD | Attending: Emergency Medicine | Admitting: Emergency Medicine

## 2018-06-01 DIAGNOSIS — F17200 Nicotine dependence, unspecified, uncomplicated: Secondary | ICD-10-CM | POA: Insufficient documentation

## 2018-06-01 DIAGNOSIS — K0889 Other specified disorders of teeth and supporting structures: Secondary | ICD-10-CM | POA: Insufficient documentation

## 2018-06-01 MED ORDER — PENICILLIN V POTASSIUM 500 MG PO TABS: 500.0000 mg | ORAL_TABLET | Freq: Four times a day (QID) | ORAL | 0 refills | Status: DC

## 2018-06-01 MED ORDER — HYDROMORPHONE HCL 1 MG/ML IJ SOLN
1.0000 mg | Freq: Once | INTRAMUSCULAR | Status: AC
  Administered 2018-06-01: 1 mg via INTRAMUSCULAR
  Filled 2018-06-01: qty 1

## 2018-06-01 MED ORDER — NAPROXEN 500 MG PO TABS: 500.0000 mg | ORAL_TABLET | Freq: Two times a day (BID) | ORAL | 1 refills | Status: DC

## 2018-06-01 NOTE — ED Notes (Signed)
Informed pt that she would need to have her ride present here before I could give her a narcotic injection.  She is calling for her ride now.

## 2018-06-01 NOTE — Discharge Instructions (Addendum)
Take the antibiotic as directed.  Take the Naprosyn for the pain and swelling.  Recommend that she follow back up with your dentist.

## 2018-06-01 NOTE — ED Provider Notes (Signed)
[facility] EMERGENCY DEPARTMENT Provider Note   CSN: [id number] Arrival date & time: 06/01/18  0807     History   Chief Complaint Chief Complaint  Patient presents with  . Dental Pain    HPI Alexandra Johnson is a 37 y.o. female.  Patient with recurrent tooth pain on the right upper tooth.  Had problems back in August.  Currently lost her insurance cannot afford the dental care.  Patient's been told she needs a root canal on the upper tooth.     Past Medical History:  Diagnosis Date  . Anxiety   . Anxiety   . Chronic back pain     There are no active problems to display for this patient.   Past Surgical History:  Procedure Laterality Date  . WISDOM TOOTH EXTRACTION       OB History   None      Home Medications    Prior to Admission medications   Medication Sig Start Date End Date Taking? Authorizing Provider  naproxen (NAPROSYN) 500 MG tablet Take 1 tablet (500 mg total) by mouth 2 (two) times daily. 06/01/18   Vanetta Mulders, MD  penicillin v potassium (VEETID) 500 MG tablet Take 1 tablet (500 mg total) by mouth 4 (four) times daily. 06/01/18   Vanetta Mulders, MD    Family History No family history on file.  Social History Social History   Tobacco Use  . Smoking status: Current Every Day Smoker    Packs/day: 1.00  . Smokeless tobacco: Never Used  Substance Use Topics  . Alcohol use: Yes    Comment: occ  . Drug use: No     Allergies   Patient has no known allergies.   Review of Systems Review of Systems  Constitutional: Negative for fever.  HENT: Positive for dental problem and facial swelling.   Eyes: Negative for redness.  Respiratory: Negative for shortness of breath.   Cardiovascular: Negative for chest pain.  Gastrointestinal: Negative for abdominal pain.  Musculoskeletal: Negative for neck stiffness.  Skin: Negative for rash.  Hematological: Does not bruise/bleed easily.  Psychiatric/Behavioral: Negative for  confusion.     Physical Exam Updated Vital Signs BP 127/90 (BP Location: Right Arm)   Pulse 93   Temp 97.8 F (36.6 C) (Oral)   Resp 16   Ht 1.6 m (5\' 3" )   Wt 77.1 kg   LMP 05/29/2018   SpO2 100%   BMI 30.11 kg/m   Physical Exam  Constitutional: She is oriented to person, place, and time. She appears well-developed and well-nourished.  HENT:  Head: Normocephalic and atraumatic.  Some slight swelling to the right upper jaw area.  Teeth appear healthy no gum swelling.  No floor the mouth swelling.  Eyes: Pupils are equal, round, and reactive to light. Conjunctivae and EOM are normal.  Neck: Neck supple.  Cardiovascular: Normal rate and regular rhythm.  Pulmonary/Chest: Effort normal.  Abdominal: Soft.  Lymphadenopathy:    She has no cervical adenopathy.  Neurological: She is alert and oriented to person, place, and time. No cranial nerve deficit or sensory deficit. She exhibits normal muscle tone. Coordination normal.  Skin: Skin is warm. No rash noted.  Nursing note and vitals reviewed.    ED Treatments / Results  Labs (all labs ordered are listed, but only abnormal results are displayed) Labs Reviewed - No data to display  EKG None  Radiology No results found.  Procedures Procedures (including critical care time)  Medications Ordered in  ED Medications  HYDROmorphone (DILAUDID) injection 1 mg (has no administration in time range)     Initial Impression / Assessment and Plan / ED Course  I have reviewed the triage vital signs and the nursing notes.  Pertinent labs & imaging results that were available during my care of the patient were reviewed by me and considered in my medical decision making (see chart for details).     Patient with some swelling to the right upper bugle area.  But no intraoral swelling.  No floor the mouth swelling.  No significant caries noted in the right upper molar.  Will treat with penicillin and anti-inflammatory.  We will give  a single dose of hydromorphone here.  Do recommend patient follow-up with dentist as possible.  Final Clinical Impressions(s) / ED Diagnoses   Final diagnoses:  Toothache    ED Discharge Orders         Ordered    naproxen (NAPROSYN) 500 MG tablet  2 times daily     06/01/18 0857    penicillin v potassium (VEETID) 500 MG tablet  4 times daily     06/01/18 0857           Vanetta Mulders, MD 06/01/18 [phone]

## 2018-06-01 NOTE — ED Triage Notes (Signed)
Tooth pain on right upper "on and off for months".  Sts the dentist wants her to see an endodontist but "I can't afford it".  Pain getting worse and radiates to front of mouth.

## 2018-08-21 ENCOUNTER — Emergency Department (HOSPITAL_BASED_OUTPATIENT_CLINIC_OR_DEPARTMENT_OTHER)
Admission: EM | Admit: 2018-08-21 | Discharge: 2018-08-21 | Disposition: A | Discharge status: Home / Self Care | Payer: Self-pay | Attending: Emergency Medicine | Admitting: Emergency Medicine

## 2018-08-21 ENCOUNTER — Encounter (HOSPITAL_BASED_OUTPATIENT_CLINIC_OR_DEPARTMENT_OTHER): Payer: Self-pay | Admitting: Emergency Medicine

## 2018-08-21 ENCOUNTER — Other Ambulatory Visit: Payer: Self-pay

## 2018-08-21 DIAGNOSIS — R070 Pain in throat: Secondary | ICD-10-CM | POA: Insufficient documentation

## 2018-08-21 DIAGNOSIS — J3489 Other specified disorders of nose and nasal sinuses: Secondary | ICD-10-CM | POA: Insufficient documentation

## 2018-08-21 DIAGNOSIS — J0100 Acute maxillary sinusitis, unspecified: Secondary | ICD-10-CM | POA: Insufficient documentation

## 2018-08-21 DIAGNOSIS — F17210 Nicotine dependence, cigarettes, uncomplicated: Secondary | ICD-10-CM | POA: Insufficient documentation

## 2018-08-21 MED ORDER — AMOXICILLIN-POT CLAVULANATE 875-125 MG PO TABS: 1.0000 | ORAL_TABLET | Freq: Two times a day (BID) | ORAL | 0 refills | Status: DC

## 2018-08-21 MED ORDER — AMOXICILLIN-POT CLAVULANATE 875-125 MG PO TABS
1.0000 | ORAL_TABLET | Freq: Once | ORAL | Status: AC
  Administered 2018-08-21: 1 via ORAL
  Filled 2018-08-21: qty 1

## 2018-08-21 MED ORDER — FLUTICASONE PROPIONATE 50 MCG/ACT NA SUSP: 2.0000 | Freq: Every day | NASAL | 0 refills | Status: DC

## 2018-08-21 MED FILL — FLUTICASONE PROP 50 MCG SPR: 50 | 30 days supply | Qty: 16 | Fill #0

## 2018-08-21 MED FILL — AMOX-CLAV 875-125 MG TABLET: 875-125 | 7 days supply | Qty: 14 | Fill #0

## 2018-08-21 NOTE — Discharge Instructions (Signed)
Take augmentin twice daily for a week.,   Use flonase for congestion   See your doctor   Return to ER if you have worse congestion, sinus pressure and discharge, fever.

## 2018-08-21 NOTE — ED Provider Notes (Signed)
[facility] EMERGENCY DEPARTMENT Provider Note   CSN: [id number] Arrival date & time: 08/21/18  0930     History   Chief Complaint Chief Complaint  Patient presents with  . Facial Pain    HPI Alexandra Johnson is a 38 y.o. female history of anxiety here presenting with sinus congestion and pressure and sore throat.  Patient states that the last 2 days she has been having facial pain as well as sinus congestion.  She states that she feels very congested and she also has some mild yellowish-greenish discharge from her nose.  Some postnasal drips and some sore throat as well.  Subjective chills but no fevers.  Denies any cough.  States that she had sinus infection many years ago with similar symptoms.  The history is provided by the patient.    Past Medical History:  Diagnosis Date  . Anxiety   . Anxiety   . Chronic back pain     There are no active problems to display for this patient.   Past Surgical History:  Procedure Laterality Date  . WISDOM TOOTH EXTRACTION       OB History   No obstetric history on file.      Home Medications    Prior to Admission medications   Medication Sig Start Date End Date Taking? Authorizing Provider  naproxen (NAPROSYN) 500 MG tablet Take 1 tablet (500 mg total) by mouth 2 (two) times daily. 06/01/18   Vanetta Mulders, MD  penicillin v potassium (VEETID) 500 MG tablet Take 1 tablet (500 mg total) by mouth 4 (four) times daily. 06/01/18   Vanetta Mulders, MD    Family History History reviewed. No pertinent family history.  Social History Social History   Tobacco Use  . Smoking status: Current Every Day Smoker    Packs/day: 1.00  . Smokeless tobacco: Never Used  Substance Use Topics  . Alcohol use: Yes    Comment: occ  . Drug use: No     Allergies   Patient has no known allergies.   Review of Systems Review of Systems  HENT: Positive for congestion.   All other systems reviewed and are  negative.    Physical Exam Updated Vital Signs BP 107/80 (BP Location: Left Arm)   Pulse (!) 102   Temp 98.8 F (37.1 C) (Oral)   Resp 20   Ht 5\' 3"  (1.6 m)   Wt 74.8 kg   LMP 08/18/2018 (Approximate)   SpO2 99%   BMI 29.23 kg/m   Physical Exam Vitals signs and nursing note reviewed.  Constitutional:      Comments: Slightly uncomfortable   HENT:     Head: Normocephalic.     Nose:     Comments: + maxillary sinus tenderness     Mouth/Throat:     Mouth: Mucous membranes are moist.     Comments: OP slightly red, no tonsillar exudates  Eyes:     Pupils: Pupils are equal, round, and reactive to light.  Neck:     Musculoskeletal: Normal range of motion.  Cardiovascular:     Rate and Rhythm: Normal rate.  Pulmonary:     Effort: Pulmonary effort is normal.  Abdominal:     General: Abdomen is flat.  Musculoskeletal: Normal range of motion.  Skin:    General: Skin is warm.     Capillary Refill: Capillary refill takes less than 2 seconds.  Neurological:     General: No focal deficit present.  Mental Status: She is alert.  Psychiatric:        Mood and Affect: Mood normal.      ED Treatments / Results  Labs (all labs ordered are listed, but only abnormal results are displayed) Labs Reviewed - No data to display  EKG None  Radiology No results found.  Procedures Procedures (including critical care time)  Medications Ordered in ED Medications  amoxicillin-clavulanate (AUGMENTIN) 875-125 MG per tablet 1 tablet (has no administration in time range)     Initial Impression / Assessment and Plan / ED Course  I have reviewed the triage vital signs and the nursing notes.  Pertinent labs & imaging results that were available during my care of the patient were reviewed by me and considered in my medical decision making (see chart for details).    Alexandra Johnson is a 38 y.o. female here with sinus pain and congestion and discharge, post nasal drip. Likely  either viral vs bacterial sinusitis. Will dc home with augmentin, flonase.    Final Clinical Impressions(s) / ED Diagnoses   Final diagnoses:  None    ED Discharge Orders    None       Charlynne Pander, MD 08/21/18 1130

## 2018-08-21 NOTE — ED Triage Notes (Signed)
Reports headache and facial pain x 2 days.  States she believes she has a sinus infection.
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
## 2020-01-06 ENCOUNTER — Other Ambulatory Visit: Payer: Self-pay

## 2020-01-06 ENCOUNTER — Encounter (HOSPITAL_BASED_OUTPATIENT_CLINIC_OR_DEPARTMENT_OTHER): Payer: Self-pay

## 2020-01-06 ENCOUNTER — Emergency Department (HOSPITAL_BASED_OUTPATIENT_CLINIC_OR_DEPARTMENT_OTHER)
Admission: EM | Admit: 2020-01-06 | Discharge: 2020-01-06 | Disposition: A | Discharge status: Home / Self Care | Payer: Self-pay | Attending: Emergency Medicine | Admitting: Emergency Medicine

## 2020-01-06 DIAGNOSIS — Z79899 Other long term (current) drug therapy: Secondary | ICD-10-CM | POA: Insufficient documentation

## 2020-01-06 DIAGNOSIS — R0981 Nasal congestion: Secondary | ICD-10-CM | POA: Insufficient documentation

## 2020-01-06 DIAGNOSIS — J020 Streptococcal pharyngitis: Secondary | ICD-10-CM | POA: Insufficient documentation

## 2020-01-06 DIAGNOSIS — F17210 Nicotine dependence, cigarettes, uncomplicated: Secondary | ICD-10-CM | POA: Insufficient documentation

## 2020-01-06 LAB — GROUP A STREP BY PCR: Group A Strep by PCR: DETECTED — AB

## 2020-01-06 MED ORDER — AMOXICILLIN 500 MG PO CAPS: 1000.0000 mg | ORAL_CAPSULE | Freq: Every day | ORAL | 0 refills | Status: AC

## 2020-01-06 MED ORDER — DEXAMETHASONE SODIUM PHOSPHATE 10 MG/ML IJ SOLN
10.0000 mg | Freq: Once | INTRAMUSCULAR | Status: AC
  Administered 2020-01-06: 10 mg via INTRAMUSCULAR
  Filled 2020-01-06: qty 1

## 2020-01-06 MED ORDER — ACETAMINOPHEN 160 MG/5ML PO SOLN
1000.0000 mg | Freq: Once | ORAL | Status: AC
  Administered 2020-01-06: 1000 mg via ORAL
  Filled 2020-01-06: qty 40.6

## 2020-01-06 NOTE — ED Provider Notes (Signed)
[facility] EMERGENCY DEPARTMENT Provider Note   CSN: [id number] Arrival date & time: 01/06/20  1001     History Chief Complaint  Patient presents with  . Sore Throat    Alexandra Johnson is a 39 y.o. female.  Patient presents with recurrent sore throat and congestion for weeks.  Patient was seen at [facility] and has appointment ENT on the third.  Patient reports this got worse and started after she snorted pain pills weeks ago.  No fevers or chills.  No other significant medical problems.  No breathing difficulty or cough.        Past Medical History:  Diagnosis Date  . Anxiety   . Anxiety   . Chronic back pain     There are no problems to display for this patient.   Past Surgical History:  Procedure Laterality Date  . WISDOM TOOTH EXTRACTION       OB History   No obstetric history on file.     No family history on file.  Social History   Tobacco Use  . Smoking status: Current Every Day Smoker    Packs/day: 1.00  . Smokeless tobacco: Never Used  Substance Use Topics  . Alcohol use: Yes    Comment: occ  . Drug use: Yes    Types: Marijuana    Comment: snorts pain pills    Home Medications Prior to Admission medications   Medication Sig Start Date End Date Taking? Authorizing Provider  amoxicillin (AMOXIL) 500 MG capsule Take 2 capsules (1,000 mg total) by mouth daily for 10 days. 01/06/20 01/16/20  Blane Ohara, MD  amoxicillin-clavulanate (AUGMENTIN) 875-125 MG tablet Take 1 tablet by mouth 2 (two) times daily. One po bid x 7 days 08/21/18   Charlynne Pander, MD  fluticasone [facility]) 50 MCG/ACT nasal spray Place 2 sprays into both nostrils daily. 08/21/18   Charlynne Pander, MD  naproxen (NAPROSYN) 500 MG tablet Take 1 tablet (500 mg total) by mouth 2 (two) times daily. 06/01/18   Vanetta Mulders, MD  penicillin v potassium (VEETID) 500 MG tablet Take 1 tablet (500 mg total) by mouth 4 (four) times daily. 06/01/18    Vanetta Mulders, MD    Allergies    Patient has no known allergies.  Review of Systems   Review of Systems  Constitutional: Negative for chills and fever.  HENT: Positive for congestion and sore throat.   Respiratory: Negative for shortness of breath.   Cardiovascular: Negative for chest pain.  Gastrointestinal: Negative for abdominal pain and vomiting.  Musculoskeletal: Negative for back pain, neck pain and neck stiffness.  Skin: Negative for rash.  Neurological: Negative for light-headedness and headaches.    Physical Exam Updated Vital Signs BP (!) 131/96 (BP Location: Right Arm)   Pulse 100   Temp 99 F (37.2 C) (Oral)   Resp 20   Ht 5\' 2"  (1.575 m)   Wt 74.8 kg   LMP 12/30/2019   SpO2 100%   BMI 30.18 kg/m   Physical Exam Vitals and nursing note reviewed.  Constitutional:      Appearance: She is well-developed.  HENT:     Head: Normocephalic and atraumatic.     Nose: Congestion present.     Mouth/Throat:     Tonsils: Tonsillar exudate present. No tonsillar abscesses.  Eyes:     General:        Right eye: No discharge.        Left eye: No  discharge.     Conjunctiva/sclera: Conjunctivae normal.  Neck:     Trachea: No tracheal deviation.  Cardiovascular:     Rate and Rhythm: Normal rate.  Pulmonary:     Effort: Pulmonary effort is normal.  Abdominal:     General: There is no distension.     Palpations: Abdomen is soft.     Tenderness: There is no abdominal tenderness. There is no guarding.  Musculoskeletal:     Cervical back: Normal range of motion and neck supple.  Lymphadenopathy:     Cervical: No cervical adenopathy.  Skin:    General: Skin is warm.     Findings: No rash.  Neurological:     Mental Status: She is alert and oriented to person, place, and time.     ED Results / Procedures / Treatments   Labs (all labs ordered are listed, but only abnormal results are displayed) Labs Reviewed  GROUP A STREP BY PCR - Abnormal; Notable for the  following components:      Result Value   Group A Strep by PCR DETECTED (*)    All other components within normal limits  GC/CHLAMYDIA PROBE AMP ([place]) NOT AT [facility]    EKG None  Radiology No results found.  Procedures Procedures (including critical care time)  Medications Ordered in ED Medications  acetaminophen (TYLENOL) 160 MG/5ML solution 1,000 mg (1,000 mg Oral Given 01/06/20 1105)  dexamethasone (DECADRON) injection 10 mg (10 mg Intramuscular Given 01/06/20 1107)    ED Course  I have reviewed the triage vital signs and the nursing notes.  Pertinent labs & imaging results that were available during my care of the patient were reviewed by me and considered in my medical decision making (see chart for details).    MDM Rules/Calculators/A&P                      Patient presents with recurrent sore throat and congestion since snorting pills.  On exam patient does have exudate posterior pharynx and drainage.  Plan for strep test and GC testing and patient has close outpatient follow-up.  No signs of abscess or more serious infection at this time. Strep test returns positive for strep pharyngitis.  Plan for antibiotics and supportive care. Final Clinical Impression(s) / ED Diagnoses Final diagnoses:  Sinus congestion  Strep pharyngitis    Rx / DC Orders ED Discharge Orders         Ordered    amoxicillin (AMOXIL) 500 MG capsule  Daily     01/06/20 1148           Elnora Morrison, MD 01/06/20 1150

## 2020-01-06 NOTE — Discharge Instructions (Addendum)
Follow-up with the specialist as previously arranged.  Return for breathing difficulties or new or worsening symptoms.  You can take Tylenol every 4 hours as needed for pain and gargle salt water. Complete antibiotics for strep throat.

## 2020-01-06 NOTE — ED Notes (Signed)
Pt discharged to home. Discharge instructions have been discussed with patient and/or family members. Pt verbally acknowledges understanding d/c instructions, and endorses comprehension to checkout at registration before leaving.  °

## 2020-01-06 NOTE — ED Triage Notes (Signed)
Pt states that she has been having pain in her throat and ear with a lot of mucus production at night "for months"Has been seen for the same at [facility]. Pt reports she has an upcoming appointment at ENT June 3rd. Pt reports history of "snorting pain pills" states that last use was one week ago.

## 2020-01-08 LAB — GC/CHLAMYDIA PROBE AMP (~~LOC~~) NOT AT ARMC
Chlamydia: NEGATIVE
Comment: NEGATIVE
Comment: NORMAL
Neisseria Gonorrhea: NEGATIVE

## 2020-03-10 ENCOUNTER — Other Ambulatory Visit: Payer: Self-pay

## 2020-03-10 ENCOUNTER — Emergency Department (HOSPITAL_BASED_OUTPATIENT_CLINIC_OR_DEPARTMENT_OTHER)
Admission: EM | Admit: 2020-03-10 | Discharge: 2020-03-10 | Disposition: A | Discharge status: Home / Self Care | Payer: Medicaid Other | Attending: Emergency Medicine | Admitting: Emergency Medicine

## 2020-03-10 ENCOUNTER — Encounter (HOSPITAL_BASED_OUTPATIENT_CLINIC_OR_DEPARTMENT_OTHER): Payer: Self-pay | Admitting: *Deleted

## 2020-03-10 DIAGNOSIS — F17200 Nicotine dependence, unspecified, uncomplicated: Secondary | ICD-10-CM | POA: Insufficient documentation

## 2020-03-10 DIAGNOSIS — Z20822 Contact with and (suspected) exposure to covid-19: Secondary | ICD-10-CM | POA: Insufficient documentation

## 2020-03-10 DIAGNOSIS — J029 Acute pharyngitis, unspecified: Secondary | ICD-10-CM | POA: Insufficient documentation

## 2020-03-10 LAB — SARS CORONAVIRUS 2 BY RT PCR (HOSPITAL ORDER, PERFORMED IN ~~LOC~~ HOSPITAL LAB): SARS Coronavirus 2: NEGATIVE

## 2020-03-10 LAB — GROUP A STREP BY PCR: Group A Strep by PCR: NOT DETECTED

## 2020-03-10 MED ORDER — DEXAMETHASONE 6 MG PO TABS
6.0000 mg | ORAL_TABLET | Freq: Once | ORAL | Status: AC
  Administered 2020-03-10: 6 mg via ORAL
  Filled 2020-03-10: qty 1

## 2020-03-10 MED ORDER — IBUPROFEN 100 MG/5ML PO SUSP
600.0000 mg | Freq: Once | ORAL | Status: AC
  Administered 2020-03-10: 600 mg via ORAL
  Filled 2020-03-10: qty 30

## 2020-03-10 NOTE — ED Provider Notes (Signed)
[facility] EMERGENCY DEPARTMENT Provider Note   CSN: [id number] Arrival date & time: 03/10/20  1155     History Chief Complaint  Patient presents with  . Ear, throat pain    Alexandra Johnson is a 39 y.o. female.  39 year old female presents with complaint of sore throat and ear pain x2 days, worse since last night.  No known sick contacts, denies fever, denies cough.  Patient states that she is not able to smell or taste her food.  Patient has not had her COVID-19 vaccine.  States the last time she felt this way she had strep.  No other complaints or concerns today.  Alexandra Johnson was evaluated in Emergency Department on 03/10/2020 for the symptoms described in the history of present illness. She was evaluated in the context of the global COVID-19 pandemic, which necessitated consideration that the patient might be at risk for infection with the SARS-CoV-2 virus that causes COVID-19. Institutional protocols and algorithms that pertain to the evaluation of patients at risk for COVID-19 are in a state of rapid change based on information released by regulatory bodies including the CDC and federal and state organizations. These policies and algorithms were followed during the patient's care in the ED.         Past Medical History:  Diagnosis Date  . Anxiety   . Anxiety   . Chronic back pain     There are no problems to display for this patient.   Past Surgical History:  Procedure Laterality Date  . WISDOM TOOTH EXTRACTION       OB History    Gravida  3   Para  1   Term      Preterm      AB      Living  1     SAB      TAB      Ectopic      Multiple      Live Births              No family history on file.  Social History   Tobacco Use  . Smoking status: Current Every Day Smoker    Packs/day: 1.00  . Smokeless tobacco: Never Used  Substance Use Topics  . Alcohol use: Yes    Comment: occ  . Drug use: Yes    Types: Marijuana     Comment: in awhile    Home Medications Prior to Admission medications   Not on File    Allergies    Patient has no known allergies.  Review of Systems   Review of Systems  Constitutional: Negative for fever.  HENT: Positive for ear pain and sore throat. Negative for congestion, ear discharge, facial swelling, sinus pressure and sinus pain.   Respiratory: Negative for cough.   Gastrointestinal: Negative for vomiting.  Musculoskeletal: Negative for arthralgias and myalgias.  Skin: Negative for rash and wound.  Allergic/Immunologic: Negative for immunocompromised state.  Neurological: Negative for weakness.  Hematological: Negative for adenopathy.  All other systems reviewed and are negative.   Physical Exam Updated Vital Signs BP (!) 141/95 (BP Location: Right Arm)   Pulse 73   Temp 99.4 F (37.4 C) (Oral)   Resp 16   Ht 5\' 2"  (1.575 m)   Wt 74.8 kg   LMP 03/10/2020   SpO2 100%   BMI 30.18 kg/m   Physical Exam Vitals and nursing note reviewed.  Constitutional:      General: She is  not in acute distress.    Appearance: She is well-developed. She is not diaphoretic.  HENT:     Head: Normocephalic and atraumatic.     Right Ear: Tympanic membrane and ear canal normal.     Left Ear: Tympanic membrane and ear canal normal.     Nose: Nose normal.     Mouth/Throat:     Mouth: Mucous membranes are moist.     Pharynx: Uvula midline. No oropharyngeal exudate or posterior oropharyngeal erythema.     Comments: Post nasal drip Eyes:     Conjunctiva/sclera: Conjunctivae normal.  Cardiovascular:     Rate and Rhythm: Normal rate and regular rhythm.     Pulses: Normal pulses.     Heart sounds: Normal heart sounds.  Pulmonary:     Effort: Pulmonary effort is normal.     Breath sounds: Normal breath sounds.  Skin:    General: Skin is warm and dry.     Findings: No erythema or rash.  Neurological:     Mental Status: She is alert and oriented to person, place, and time.    Psychiatric:        Behavior: Behavior normal.     ED Results / Procedures / Treatments   Labs (all labs ordered are listed, but only abnormal results are displayed) Labs Reviewed  GROUP A STREP BY PCR  SARS CORONAVIRUS 2 BY RT PCR (HOSPITAL ORDER, PERFORMED IN [facility])    EKG None  Radiology No results found.  Procedures Procedures (including critical care time)  Medications Ordered in ED Medications  ibuprofen (ADVIL) 100 MG/5ML suspension 600 mg (600 mg Oral Given 03/10/20 1628)  dexamethasone (DECADRON) tablet 6 mg (6 mg Oral Given 03/10/20 1628)    ED Course  I have reviewed the triage vital signs and the nursing notes.  Pertinent labs & imaging results that were available during my care of the patient were reviewed by me and considered in my medical decision making (see chart for details).  Clinical Course as of Mar 11 1635  Mon Mar 10, 2020  1447 [phone]   [LM]  728 39 year old female with complaint of left ear pain and sore throat x 2 days, similar to prior strep. Also unable to smell or taste food. No known exposure to strep/covid. On exam, appears to feel unwell but not toxin in appearance. Post nasal drip present. Plan is to test for COVID and strep, will contact patient with her results when available.    [LM]  1620 Strep test is negative.  Patient was given Decadron and Motrin for her pharyngitis.   [LM]  1636 Covid negative.   [LM]    Clinical Course User Index [LM] Alden Hipp   MDM Rules/Calculators/A&P                          Final Clinical Impression(s) / ED Diagnoses Final diagnoses:  Pharyngitis, unspecified etiology    Rx / DC Orders ED Discharge Orders    None       Jeannie Fend, PA-C 03/10/20 1636    Alvira Monday, MD 03/10/20 2246

## 2020-03-10 NOTE — ED Triage Notes (Signed)
Ears and throat pain since last night.  Denies fever.

## 2020-03-10 NOTE — Discharge Instructions (Addendum)
Your strep test today is negative.  Your Covid test is pending.  Recommend checking your MyChart later this evening for your Covid test results.  If your Covid test is positive, you will need to quarantine for a total of 10 days since her symptoms started. You were given Decadron and Motrin in the ER today to treat your pharyngitis, this should help over the next 24 hours, recommend home to rest, be sure to drink plenty of hydrating fluids.

## 2020-07-07 ENCOUNTER — Encounter (HOSPITAL_BASED_OUTPATIENT_CLINIC_OR_DEPARTMENT_OTHER): Payer: Self-pay | Admitting: *Deleted

## 2020-07-07 ENCOUNTER — Emergency Department (HOSPITAL_BASED_OUTPATIENT_CLINIC_OR_DEPARTMENT_OTHER): Payer: Self-pay

## 2020-07-07 ENCOUNTER — Other Ambulatory Visit (HOSPITAL_BASED_OUTPATIENT_CLINIC_OR_DEPARTMENT_OTHER): Payer: Self-pay | Admitting: Physician Assistant

## 2020-07-07 ENCOUNTER — Emergency Department (HOSPITAL_BASED_OUTPATIENT_CLINIC_OR_DEPARTMENT_OTHER)
Admission: EM | Admit: 2020-07-07 | Discharge: 2020-07-07 | Disposition: A | Discharge status: Home / Self Care | Payer: Self-pay | Attending: Emergency Medicine | Admitting: Emergency Medicine

## 2020-07-07 ENCOUNTER — Other Ambulatory Visit: Payer: Self-pay

## 2020-07-07 DIAGNOSIS — J189 Pneumonia, unspecified organism: Secondary | ICD-10-CM | POA: Insufficient documentation

## 2020-07-07 DIAGNOSIS — F17200 Nicotine dependence, unspecified, uncomplicated: Secondary | ICD-10-CM | POA: Insufficient documentation

## 2020-07-07 DIAGNOSIS — Z20822 Contact with and (suspected) exposure to covid-19: Secondary | ICD-10-CM | POA: Insufficient documentation

## 2020-07-07 LAB — CBC
HCT: 36.4 % (ref 36.0–46.0)
Hemoglobin: 11.9 g/dL — ABNORMAL LOW (ref 12.0–15.0)
MCH: 29.3 pg (ref 26.0–34.0)
MCHC: 32.7 g/dL (ref 30.0–36.0)
MCV: 89.7 fL (ref 80.0–100.0)
Platelets: 339 10*3/uL (ref 150–400)
RBC: 4.06 MIL/uL (ref 3.87–5.11)
RDW: 13.3 % (ref 11.5–15.5)
WBC: 10.8 10*3/uL — ABNORMAL HIGH (ref 4.0–10.5)
nRBC: 0 % (ref 0.0–0.2)

## 2020-07-07 LAB — D-DIMER, QUANTITATIVE: D-Dimer, Quant: 0.56 ug/mL-FEU — ABNORMAL HIGH (ref 0.00–0.50)

## 2020-07-07 LAB — BASIC METABOLIC PANEL
Anion gap: 9 (ref 5–15)
BUN: 10 mg/dL (ref 6–20)
CO2: 26 mmol/L (ref 22–32)
Calcium: 9 mg/dL (ref 8.9–10.3)
Chloride: 101 mmol/L (ref 98–111)
Creatinine, Ser: 0.68 mg/dL (ref 0.44–1.00)
GFR, Estimated: >60 mL/min (ref >60)
Glucose, Bld: 100 mg/dL — ABNORMAL HIGH (ref 70–99)
Potassium: 3.7 mmol/L (ref 3.5–5.1)
Sodium: 136 mmol/L (ref 135–145)

## 2020-07-07 LAB — RESP PANEL BY RT-PCR (FLU A&B, COVID) ARPGX2
Influenza A by PCR: NEGATIVE
Influenza B by PCR: NEGATIVE
SARS Coronavirus 2 by RT PCR: NEGATIVE

## 2020-07-07 LAB — GROUP A STREP BY PCR: Group A Strep by PCR: NOT DETECTED

## 2020-07-07 LAB — PREGNANCY, URINE: Preg Test, Ur: NEGATIVE

## 2020-07-07 MED ORDER — IOHEXOL 350 MG/ML SOLN
100.0000 mL | Freq: Once | INTRAVENOUS | Status: AC | PRN
  Administered 2020-07-07: 100 mL via INTRAVENOUS

## 2020-07-07 MED ORDER — ALBUTEROL SULFATE HFA 108 (90 BASE) MCG/ACT IN AERS
INHALATION_SPRAY | RESPIRATORY_TRACT | Status: AC
  Administered 2020-07-07: 09:00:00 4
  Filled 2020-07-07: qty 6.7

## 2020-07-07 MED ORDER — ACETAMINOPHEN 325 MG PO TABS
650.0000 mg | ORAL_TABLET | Freq: Once | ORAL | Status: AC
  Administered 2020-07-07: 650 mg via ORAL
  Filled 2020-07-07: qty 2

## 2020-07-07 MED ORDER — IPRATROPIUM-ALBUTEROL 0.5-2.5 (3) MG/3ML IN SOLN
3.0000 mL | Freq: Once | RESPIRATORY_TRACT | Status: AC
  Administered 2020-07-07: 3 mL via RESPIRATORY_TRACT
  Filled 2020-07-07: qty 3

## 2020-07-07 MED ORDER — AMOXICILLIN 500 MG PO CAPS: 1000.0000 mg | ORAL_CAPSULE | Freq: Three times a day (TID) | ORAL | 0 refills | Status: DC

## 2020-07-07 MED FILL — AMOXICILLIN 500 MG CAPSULE: 500 | 5 days supply | Qty: 30 | Fill #0

## 2020-07-07 NOTE — ED Provider Notes (Signed)
[facility] EMERGENCY DEPARTMENT Provider Note   CSN: [id number] Arrival date & time: 07/07/20  5277     History Chief Complaint  Patient presents with  . Shortness of Breath  . Cough    Alexandra Johnson is a 39 y.o. female.  HPI   39 year old female with a history of anxiety, chronic back pain presents to the ER with 3 days of productive cough, nasal congestion, frontal sinus pain, weakness, body aches, headaches.  She also endorses a sore throat.  Denies any drooling, inability to swallow, voice changes.  Patient states she is not vaccinated for COVID-19.  Reports some shortness of breath and weakness on exertion.  States she has had some significant wheezing.  Ports overall poor p.o. intake and feeling poorly.  She is not on any oral birth control, no recent travel or surgeries.  Denies any nausea, diarrhea, or loss of taste or smell.  She has not noticed any lower extremity swelling.  Past Medical History:  Diagnosis Date  . Anxiety   . Anxiety   . Chronic back pain     There are no problems to display for this patient.   Past Surgical History:  Procedure Laterality Date  . WISDOM TOOTH EXTRACTION       OB History    Gravida  3   Para  1   Term      Preterm      AB      Living  1     SAB      TAB      Ectopic      Multiple      Live Births              History reviewed. No pertinent family history.  Social History   Tobacco Use  . Smoking status: Current Every Day Smoker    Packs/day: 1.00  . Smokeless tobacco: Never Used  Substance Use Topics  . Alcohol use: Yes    Comment: occ  . Drug use: Yes    Types: Marijuana    Comment: in awhile    Home Medications Prior to Admission medications   Medication Sig Start Date End Date Taking? Authorizing Provider  amoxicillin (AMOXIL) 500 MG capsule Take 2 capsules (1,000 mg total) by mouth 3 (three) times daily for 5 days. 07/07/20 07/12/20  Mare Ferrari, PA-C    Allergies     Patient has no known allergies.  Review of Systems   Review of Systems  Constitutional: Positive for activity change, appetite change and fatigue. Negative for chills and fever.  HENT: Negative for ear pain and sore throat.   Eyes: Negative for pain and visual disturbance.  Respiratory: Positive for cough and shortness of breath.   Cardiovascular: Negative for chest pain and palpitations.  Gastrointestinal: Negative for abdominal pain, diarrhea, nausea and vomiting.  Genitourinary: Negative for dysuria and hematuria.  Musculoskeletal: Negative for arthralgias and back pain.  Skin: Negative for color change and rash.  Neurological: Positive for weakness and headaches. Negative for seizures and syncope.  All other systems reviewed and are negative.   Physical Exam Updated Vital Signs BP 121/80 (BP Location: Right Arm)   Pulse 73   Temp 98.4 F (36.9 C) (Oral)   Resp 14   Ht 5\' 3"  (1.6 m)   Wt 74.8 kg   LMP 07/07/2020   SpO2 93%   BMI 29.23 kg/m   Physical Exam Vitals and nursing note reviewed.  Constitutional:  General: She is not in acute distress.    Appearance: She is well-developed. She is not ill-appearing or diaphoretic.  HENT:     Head: Normocephalic and atraumatic.     Mouth/Throat:     Mouth: Mucous membranes are moist.     Comments: Oropharynx non erythematous without exudates, uvula midline, no unilateral tonsillar swelling, tongue normal size and midline, no sublingual/submandibular swellimg, tolerating secretions well    Eyes:     Conjunctiva/sclera: Conjunctivae normal.  Cardiovascular:     Rate and Rhythm: Normal rate and regular rhythm.     Heart sounds: No murmur heard.   Pulmonary:     Effort: Pulmonary effort is normal. No tachypnea, bradypnea, accessory muscle usage or respiratory distress.     Breath sounds: Examination of the right-upper field reveals wheezing. Examination of the left-upper field reveals wheezing. Examination of the  right-lower field reveals wheezing. Examination of the left-lower field reveals wheezing. Wheezing (Scattered wheezes throughout all lung fields) present. No rhonchi or rales.  Chest:     Chest wall: No tenderness.  Abdominal:     Palpations: Abdomen is soft.     Tenderness: There is no abdominal tenderness.  Musculoskeletal:     Cervical back: Normal range of motion and neck supple.     Right lower leg: No tenderness. No edema.     Left lower leg: No tenderness. No edema.  Skin:    General: Skin is warm and dry.     Capillary Refill: Capillary refill takes less than 2 seconds.     Findings: No erythema or rash.  Neurological:     General: No focal deficit present.     Mental Status: She is alert.  Psychiatric:        Mood and Affect: Mood normal.        Behavior: Behavior normal.     ED Results / Procedures / Treatments   Labs (all labs ordered are listed, but only abnormal results are displayed) Labs Reviewed  D-DIMER, QUANTITATIVE (NOT AT [facility]) - Abnormal; Notable for the following components:      Result Value   D-Dimer, Quant 0.56 (*)    All other components within normal limits  CBC - Abnormal; Notable for the following components:   WBC 10.8 (*)    Hemoglobin 11.9 (*)    All other components within normal limits  BASIC METABOLIC PANEL - Abnormal; Notable for the following components:   Glucose, Bld 100 (*)    All other components within normal limits  RESP PANEL BY RT-PCR (FLU A&B, COVID) [id number]  GROUP A STREP BY PCR  PREGNANCY, URINE    EKG EKG Interpretation  Date/Time:  Monday July 07 2020 09:12:50 EST Ventricular Rate:  103 PR Interval:    QRS Duration: 76 QT Interval:  329 QTC Calculation: 431 R Axis:   72 Text Interpretation: Sinus tachycardia Consider right atrial enlargement No previous ECGs available Confirmed by Vanetta MuldersZackowski, Scott [phone]([id number]) on 07/07/2020 10:05:00 AM   Radiology CT Angio Chest PE W and/or Wo Contrast  Result Date:  07/07/2020 CLINICAL DATA:  Wheezing shortness of breath.  Positive D-dimer. EXAM: CT ANGIOGRAPHY CHEST WITH CONTRAST TECHNIQUE: Multidetector CT imaging of the chest was performed using the standard protocol during bolus administration of intravenous contrast. Multiplanar CT image reconstructions and MIPs were obtained to evaluate the vascular anatomy. CONTRAST:  100mL OMNIPAQUE IOHEXOL 350 MG/ML SOLN COMPARISON:  None. FINDINGS: Cardiovascular: Heart size upper normal. No substantial pericardial effusion. No filling defect in  the opacified pulmonary arteries to indicate the presence of an acute pulmonary embolus. Mediastinum/Nodes: No mediastinal lymphadenopathy. There is no hilar lymphadenopathy. The esophagus has normal imaging features. There is no axillary lymphadenopathy. Lungs/Pleura: Patchy areas of ground-glass attenuation are seen in the lungs bilaterally, nonspecific but multifocal infectious/inflammatory etiology suspected. No pleural effusion. Upper Abdomen: Unremarkable. Musculoskeletal: No worrisome lytic or sclerotic osseous abnormality. Review of the MIP images confirms the above findings. IMPRESSION: 1. No CT evidence for acute pulmonary embolus. 2. Patchy areas of ground-glass attenuation in the lungs bilaterally, nonspecific and potentially related to air trapping but multifocal infectious/inflammatory etiology not excluded. Electronically Signed   By: Kennith Center M.D.   On: 07/07/2020 13:09   DG Chest Portable 1 View  Result Date: 07/07/2020 CLINICAL DATA:  Shortness of breath EXAM: PORTABLE CHEST 1 VIEW COMPARISON:  08/07/2019 FINDINGS: The heart size and mediastinal contours are within normal limits. Both lungs are clear. No pleural effusion. The visualized skeletal structures are unremarkable. IMPRESSION: No acute process in the chest. Electronically Signed   By: Guadlupe Spanish M.D.   On: 07/07/2020 10:31    Procedures Procedures (including critical care time)  Medications  Ordered in ED Medications  albuterol (VENTOLIN HFA) 108 (90 Base) MCG/ACT inhaler (4 puffs  Given 07/07/20 0907)  ipratropium-albuterol (DUONEB) 0.5-2.5 (3) MG/3ML nebulizer solution 3 mL (3 mLs Nebulization Given 07/07/20 1125)  acetaminophen (TYLENOL) tablet 650 mg (650 mg Oral Given 07/07/20 1204)  iohexol (OMNIPAQUE) 350 MG/ML injection 100 mL (100 mLs Intravenous Contrast Given 07/07/20 1250)    ED Course  I have reviewed the triage vital signs and the nursing notes.  Pertinent labs & imaging results that were available during my care of the patient were reviewed by me and considered in my medical decision making (see chart for details).    MDM Rules/Calculators/A&P                         39 year old female who presents to the ER with complaints of cough, weakness, headache, nasal congestion On presentation, she is alert, oriented, nontoxic-appearing, no acute distress, speaking in full sentences without increased work of breathing.  Vitals on arrival with some tachycardia as high as 120, afebrile, pulse ox at 96%.  Patient was ambulated from the lounge to the triage room, pulse ox between 92 to 93%.  Shortly after the patient was roomed, RTA responded to a low oxygen saturation alarm.  O2 sats rate 87%.  She was placed on 2 L nasal cannula, sats came up to the mid 90s.  Patient was placed back on room air by myself, sats in the upper 90s at rest.  On exam, she has scattered wheezes throughout, however does not appear to be any respiratory distress.  DDx includes pneumonia, asthma, COVID-19, PE  Labs and imaging reviewed and interpreted by myself -Covid and strep tests are negative.  Chest x-ray without evidence of infection.  Patient does not have a prior history of asthma.  She received 4 puffs of albuterol on arrival to the ED, still has wheezes throughout.  Given negative Covid test, will give breathing treatment, order D-dimer to rule out PE.   CT with patchy areas of groundglass  attenuation in the lungs bilaterally, likely nonspecific but multifocal infectious/inflammatory etiology cannot be excluded.  Patient notes improvement after nebulizer.  Ambulated again in the ED with no evidence of hypoxia.  Patient has remained off supplemental O2 throughout her ED stay, sats remained  in the mid 90s.  As per discussion with Dr. Deretha Emory, will treat for CAP.  Will send in amoxicillin 1g 3 times daily x5 days.Will send home with albuterol inhaler.   Patient was overall reassured by the work-up.  Return precautions discussed.  She voiced understanding and is agreeable.  At this stage in the ED course, the patient is medically screened and stable for discharge.   Final Clinical Impression(s) / ED Diagnoses Final diagnoses:  Community acquired pneumonia, unspecified laterality    Rx / DC Orders ED Discharge Orders         Ordered    amoxicillin (AMOXIL) 500 MG capsule  3 times daily        07/07/20 1346           Leone Brand 07/07/20 1353    Vanetta Mulders, MD 07/08/20 [phone]

## 2020-07-07 NOTE — ED Notes (Signed)
checking  SpO2 alarms, SpO2 87% on r/a, patient in no distress, placed on 2l/m Price.

## 2020-07-07 NOTE — Discharge Instructions (Addendum)
Your work-up today was overall reassuring.  CT scan of your chest shows a questionable pneumonia.  We will send in antibiotics for this.  Please take this as directed until finished. You can also use the inhaler as needed. You may also take over-the-counter cold and flu medicines.  Please follow-up with a primary care doctor.  You do not have one, I have provided the contact information for [facility] which is a free clinic in the area.  Return to the ER for any new or worsening symptoms.

## 2020-07-07 NOTE — ED Triage Notes (Signed)
Coughing and wheezing and SOB for 3 days.  Temp yesterday 100.00

## 2020-07-07 NOTE — ED Notes (Signed)
Ambulated from lobby to triage room, SpO2 92-93%, HR 113-120, RR 18-24.  BBS with diffuse wheezes and rhonchi.

## 2020-07-07 NOTE — ED Notes (Signed)
Tolerated ambulation trial with no increases shortness of breath. Oxygen Sat remained 94-95%

## 2022-08-13 ENCOUNTER — Encounter: Payer: Self-pay | Admitting: Emergency Medicine

## 2022-08-13 ENCOUNTER — Ambulatory Visit
Admission: EM | Admit: 2022-08-13 | Discharge: 2022-08-13 | Disposition: A | Discharge status: Home / Self Care | Payer: Commercial Managed Care - HMO | Attending: Nurse Practitioner | Admitting: Nurse Practitioner

## 2022-08-13 DIAGNOSIS — R1030 Lower abdominal pain, unspecified: Secondary | ICD-10-CM

## 2022-08-13 DIAGNOSIS — N3001 Acute cystitis with hematuria: Secondary | ICD-10-CM

## 2022-08-13 LAB — POCT URINALYSIS DIP (MANUAL ENTRY)
Bilirubin, UA: NEGATIVE
Glucose, UA: NEGATIVE mg/dL
Ketones, POC UA: NEGATIVE mg/dL
Nitrite, UA: POSITIVE — AB
Protein Ur, POC: 100 mg/dL — AB
Spec Grav, UA: 1.02 (ref 1.010–1.025)
Urobilinogen, UA: 1 E.U./dL
pH, UA: 8.5 — AB (ref 5.0–8.0)

## 2022-08-13 LAB — POCT URINE PREGNANCY: Preg Test, Ur: NEGATIVE

## 2022-08-13 MED ORDER — NITROFURANTOIN MONOHYD MACRO 100 MG PO CAPS: 100.0000 mg | ORAL_CAPSULE | Freq: Two times a day (BID) | ORAL | 0 refills | Status: AC

## 2022-08-13 NOTE — Discharge Instructions (Addendum)
Start Macrobid twice daily for 7 days Rest and fluids If you feel like your abdominal pain is not improving on the antibiotics and or begins to worsen please go to the emergency room ASAP for further evaluation

## 2022-08-13 NOTE — ED Triage Notes (Signed)
Patient c/o LLQ cramping before cycle started x 2 days ago.  Bleeding more than normal.  Denies vomiting and diarrhea, having some nausea.  Patient has taken Ibuprofen and Tylenol.

## 2022-08-13 NOTE — ED Provider Notes (Addendum)
[facility]    CSN: [id number] Arrival date & time: 08/13/22  1249      History   Chief Complaint Chief Complaint  Patient presents with   Abdominal Pain    HPI Alexandra Johnson is a 42 y.o. female Patient is a 42 y.o. female who presents for evaluation of lower abdominal pain for the past 5-6 days. Pain is described as an squeezing cramping, severity is 5-10/10 and does not radiate.  Pain is constant with wavering intensity.  States she feels the pain occasionally radiates towards her back.  She started her menses 2 days ago and reports her flow feels heavier than normal.  Denies history of ovarian cyst or endometriosis.  Last bowel movement was yesterday and was normal for her.  She is otherwise eating and drinking normally.  No history of any abdominal surgeries.  Denies any dysuria or flank pain.  She has not taken any OTC medications for symptoms.  She is not on birth control. Onset: Sudden Associated symptoms: Nausea, decreased appetite Denies: Fevers, chills, vomiting, constipation or diarrhea Recent travel : No She does not feel that her abdominal pain is worsening.    Abdominal Pain Associated symptoms: nausea     Past Medical History:  Diagnosis Date   Anxiety    Anxiety    Chronic back pain     There are no problems to display for this patient.   Past Surgical History:  Procedure Laterality Date   WISDOM TOOTH EXTRACTION      OB History     Gravida  3   Para  1   Term      Preterm      AB      Living  1      SAB      IAB      Ectopic      Multiple      Live Births               Home Medications    Prior to Admission medications   Medication Sig Start Date End Date Taking? Authorizing Provider  nitrofurantoin, macrocrystal-monohydrate, (MACROBID) 100 MG capsule Take 1 capsule (100 mg total) by mouth 2 (two) times daily for 7 days. 08/13/22 08/20/22 Yes Melynda Ripple, NP    Family History History reviewed. No  pertinent family history.  Social History Social History   Tobacco Use   Smoking status: Every Day    Packs/day: 1.00    Types: Cigarettes   Smokeless tobacco: Never  Substance Use Topics   Alcohol use: Yes    Comment: occ   Drug use: Yes    Types: Marijuana    Comment: in awhile     Allergies   Patient has no known allergies.   Review of Systems Review of Systems  Constitutional:  Positive for appetite change.  Gastrointestinal:  Positive for abdominal pain and nausea.     Physical Exam Triage Vital Signs ED Triage Vitals  Enc Vitals Group     BP 08/13/22 1413 110/75     Pulse Rate 08/13/22 1413 91     Resp 08/13/22 1413 18     Temp 08/13/22 1413 97.9 F (36.6 C)     Temp Source 08/13/22 1413 Oral     SpO2 08/13/22 1413 93 %     Weight 08/13/22 1415 180 lb (81.6 kg)     Height 08/13/22 1415 5\' 3"  (1.6 m)     Head Circumference --  Peak Flow --      Pain Score 08/13/22 1415 10     Pain Loc --      Pain Edu? --      Excl. in GC? --    No data found.  Updated Vital Signs BP 110/75 (BP Location: Right Arm)   Pulse 91   Temp 97.9 F (36.6 C) (Oral)   Resp 18   Ht 5\' 3"  (1.6 m)   Wt 180 lb (81.6 kg)   LMP 08/11/2022 (Exact Date)   SpO2 93%   BMI 31.89 kg/m   Visual Acuity Right Eye Distance:   Left Eye Distance:   Bilateral Distance:    Right Eye Near:   Left Eye Near:    Bilateral Near:     Physical Exam Vitals and nursing note reviewed.  Constitutional:      Appearance: She is well-developed. She is obese.  HENT:     Head: Normocephalic and atraumatic.  Eyes:     Pupils: Pupils are equal, round, and reactive to light.  Cardiovascular:     Rate and Rhythm: Normal rate.  Pulmonary:     Effort: Pulmonary effort is normal.  Abdominal:     General: Bowel sounds are normal.     Palpations: Abdomen is soft. There is no hepatomegaly or splenomegaly.     Tenderness: There is abdominal tenderness in the right lower quadrant and left  lower quadrant. There is no right CVA tenderness, left CVA tenderness or guarding. Negative signs include Rovsing's sign and McBurney's sign.  Skin:    General: Skin is warm and dry.  Neurological:     General: No focal deficit present.     Mental Status: She is alert and oriented to person, place, and time.  Psychiatric:        Mood and Affect: Mood normal.        Behavior: Behavior normal.      UC Treatments / Results  Labs (all labs ordered are listed, but only abnormal results are displayed) Labs Reviewed  POCT URINALYSIS DIP (MANUAL ENTRY) - Abnormal; Notable for the following components:      Result Value   Color, UA brown (*)    Clarity, UA cloudy (*)    Blood, UA large (*)    pH, UA 8.5 (*)    Protein Ur, POC =100 (*)    Nitrite, UA Positive (*)    Leukocytes, UA Trace (*)    All other components within normal limits  URINE CULTURE  POCT URINE PREGNANCY    EKG   Radiology No results found.  Procedures Procedures (including critical care time)  Medications Ordered in UC Medications - No data to display  Initial Impression / Assessment and Plan / UC Course  I have reviewed the triage vital signs and the nursing notes.  Pertinent labs & imaging results that were available during my care of the patient were reviewed by me and considered in my medical decision making (see chart for details).      Reviewed exam and sx with patient UA positive for UTI, will culture and start Macrobid Discussed with patient abdominal pain could be caused by UTI.  I did instruct her if her abdominal pain does not improve and or begins to worsen in the next 24 hours she is to go to the ER ASAP for further evaluation and possible imaging. Pt verbalized understanding of instruction and all questions answered F/U with PCP in 2-3 days for re-check Strict  ER precautions reviewed and patient verbalized understanding Pt was discharged in stable condition and in NAD  Final Clinical  Impressions(s) / UC Diagnoses   Final diagnoses:  Lower abdominal pain  Acute cystitis with hematuria     Discharge Instructions      Start Macrobid twice daily for 7 days Rest and fluids If you feel like your abdominal pain is not improving on the antibiotics and or begins to worsen please go to the emergency room ASAP for further evaluation     ED Prescriptions     Medication Sig Dispense Auth. Provider   nitrofurantoin, macrocrystal-monohydrate, (MACROBID) 100 MG capsule Take 1 capsule (100 mg total) by mouth 2 (two) times daily for 7 days. 14 capsule Melynda Ripple, NP      PDMP not reviewed this encounter.   Melynda Ripple, NP 08/13/22 1505    Melynda Ripple, NP 08/13/22 [phone]

## 2022-08-15 LAB — URINE CULTURE: Culture: >=100000 — AB

## 2022-09-09 IMAGING — CT CT ANGIO CHEST
2 of 8 series · 19 of 36 positions shown · IV contrast (Omnipaque)
Comparison: None.

CLINICAL DATA: Wheezing shortness of breath.  Positive D-dimer.

EXAM:
CT ANGIOGRAPHY CHEST WITH CONTRAST
TECHNIQUE: Multidetector CT imaging of the chest was performed using the
standard protocol during bolus administration of intravenous
contrast. Multiplanar CT image reconstructions and MIPs were
obtained to evaluate the vascular anatomy.
CONTRAST:  100mL OMNIPAQUE IOHEXOL 350 MG/ML SOLN

[Series 6: pe coronal mpr · coronal · 0.50mm/px · 1 of 115 slices shown]
[im 58/115  mediastinal]
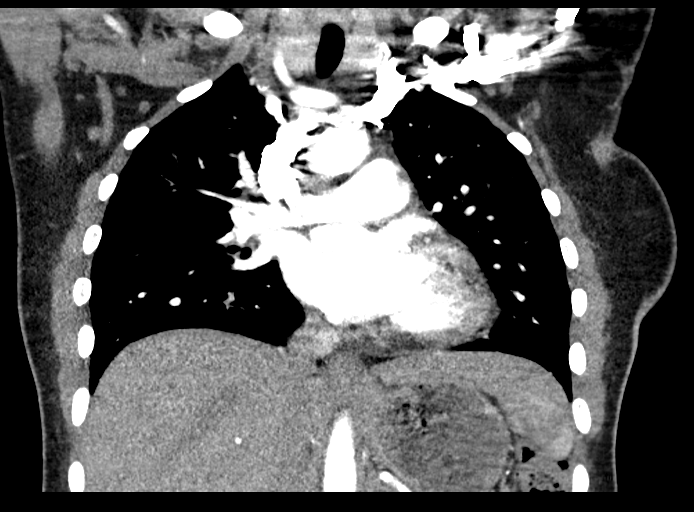

[Series 10: pe thins · axial · 0.66mm/px · z∈[-219,-4]mm · 18 of 241 slices shown]
[im 13/241  lung]
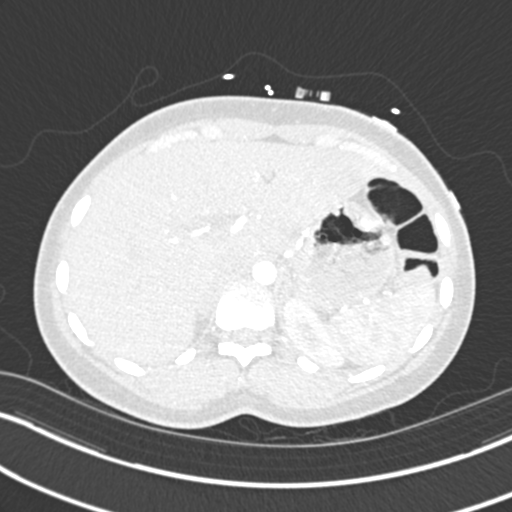
[im 26/241  mediastinal]
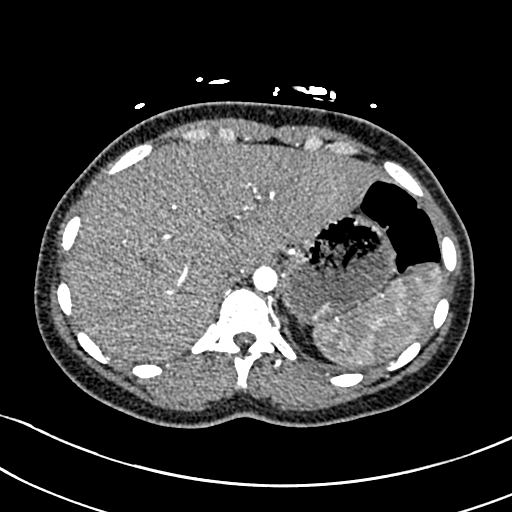
[im 38/241  lung]
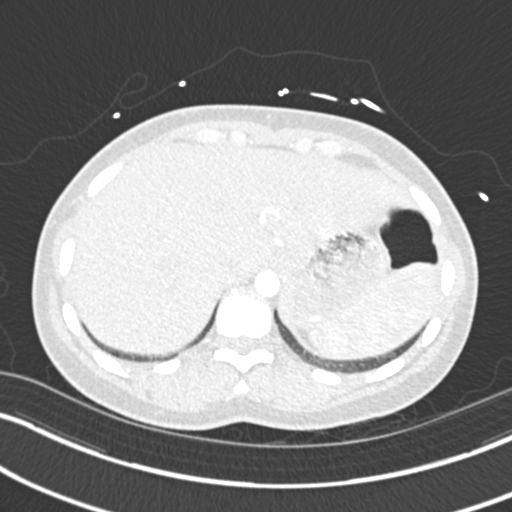
[im 51/241  mediastinal]
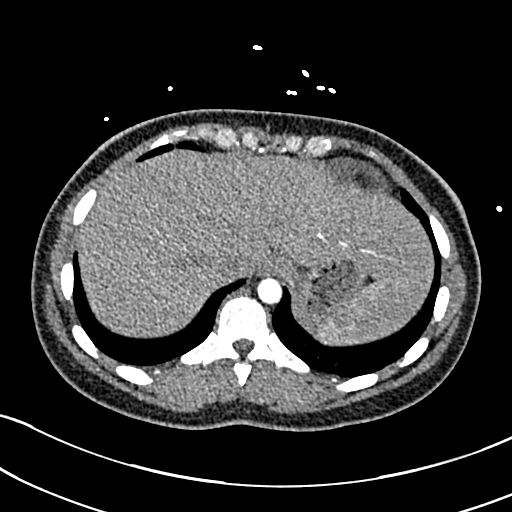
[im 64/241  lung]
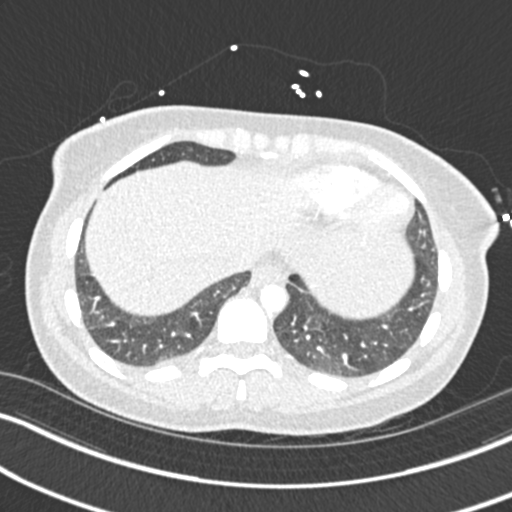
[im 76/241  mediastinal]
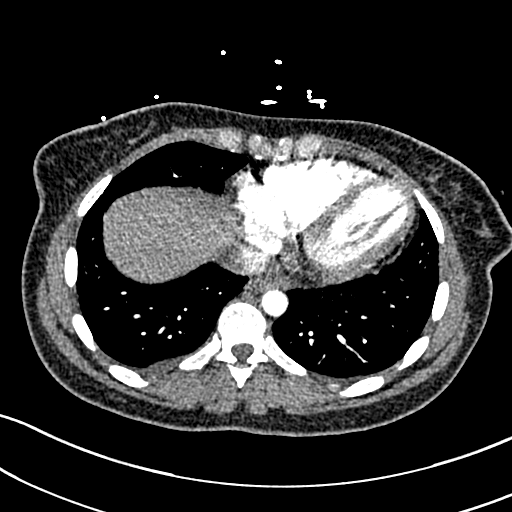
[im 89/241  lung]
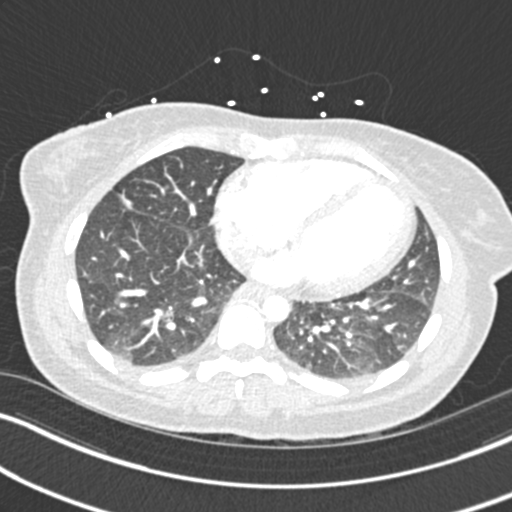
[im 102/241  mediastinal]
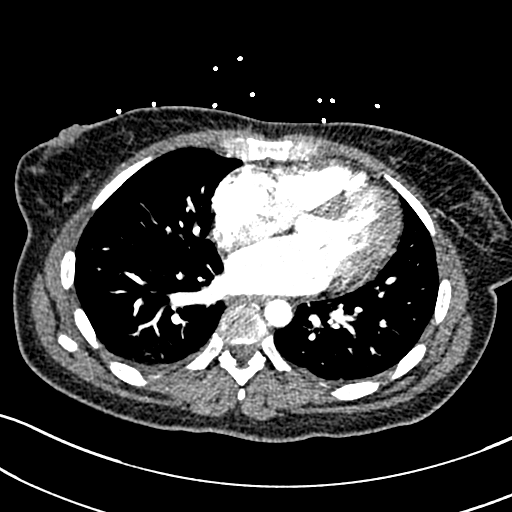
[im 114/241  lung]
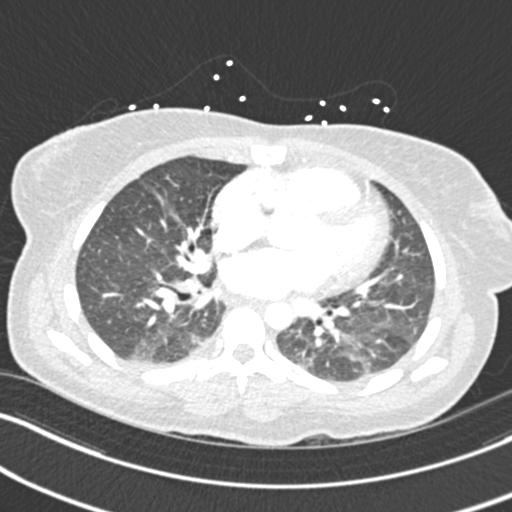
[im 127/241  mediastinal]
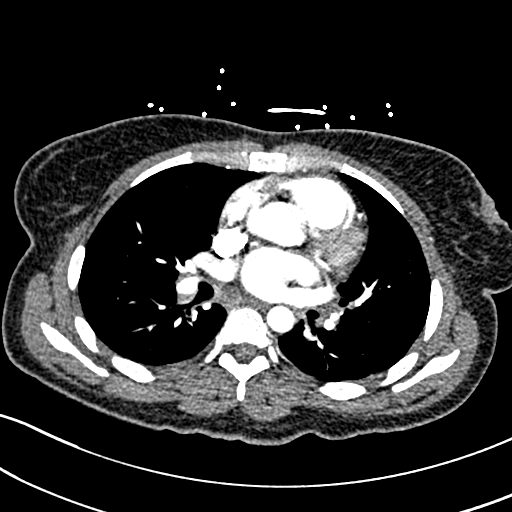
[im 139/241  lung]
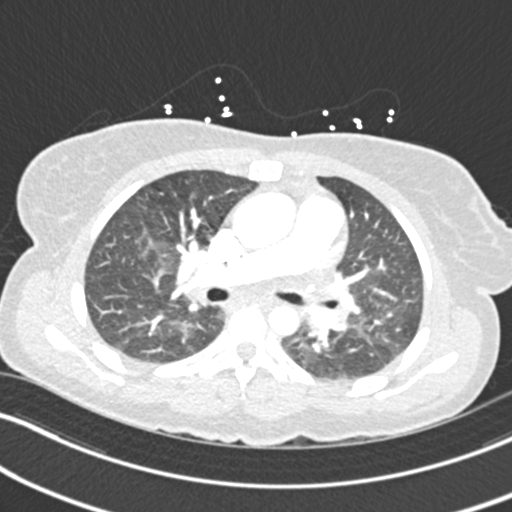
[im 152/241  mediastinal]
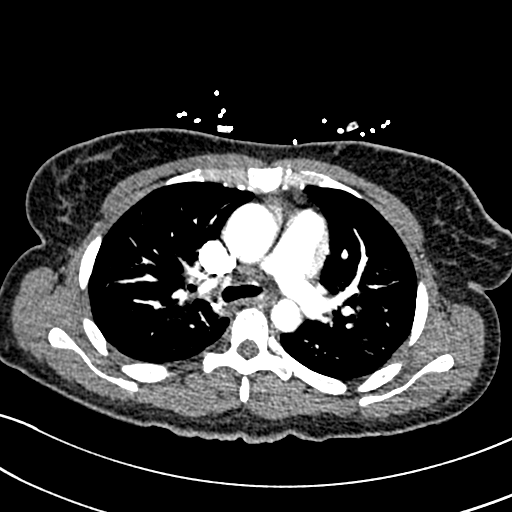
[im 165/241  lung]
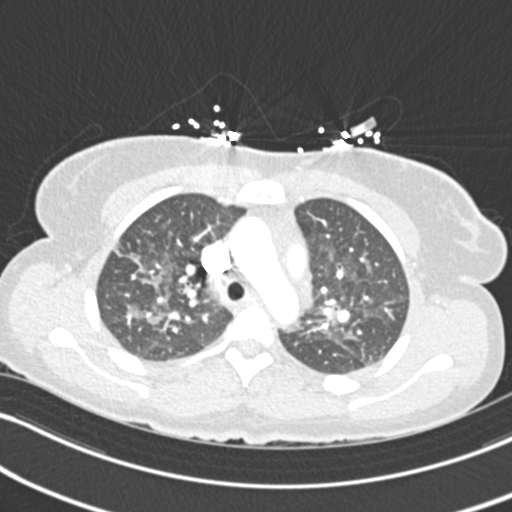
[im 177/241  mediastinal]
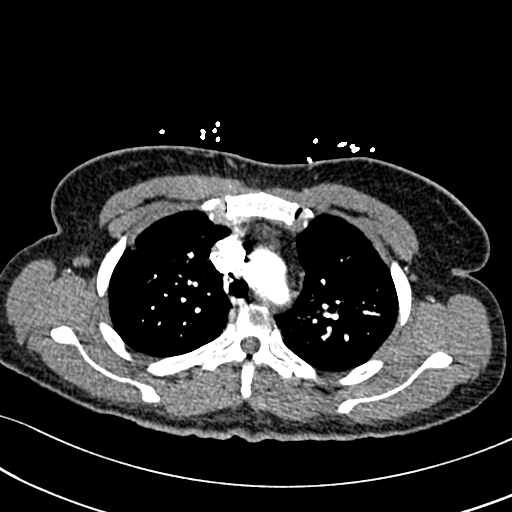
[im 190/241  lung]
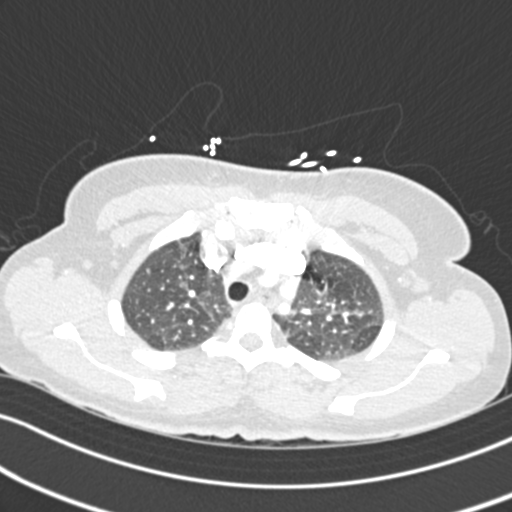
[im 203/241  mediastinal]
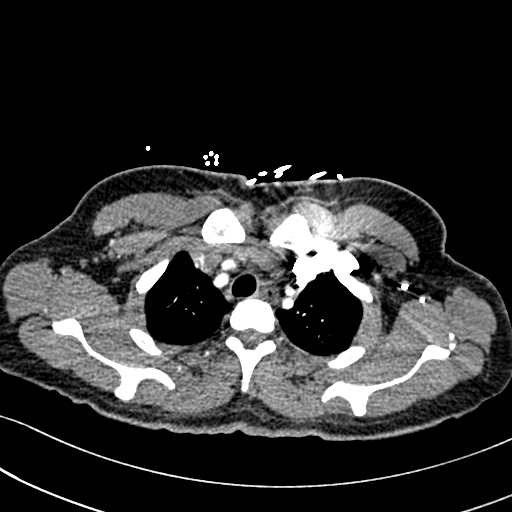
[im 215/241  lung]
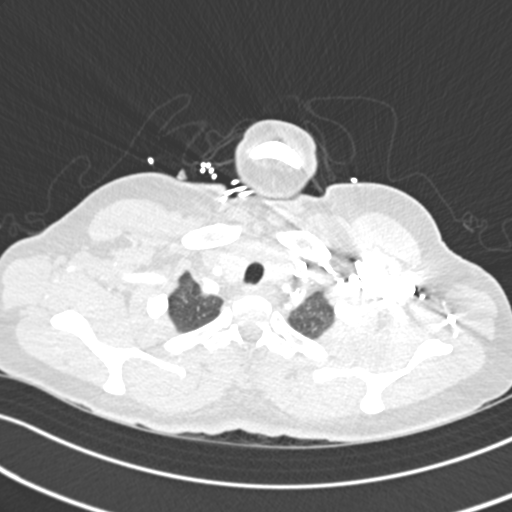
[im 228/241  mediastinal]
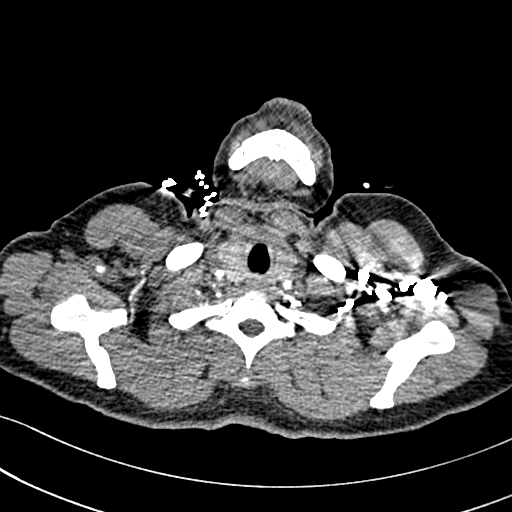

[19 of 36 positions shown; findings below may reference images not displayed]

FINDINGS: Cardiovascular: Heart size upper normal. No substantial pericardial
effusion. No filling defect in the opacified pulmonary arteries to
indicate the presence of an acute pulmonary embolus.

Mediastinum/Nodes: No mediastinal lymphadenopathy. There is no hilar
lymphadenopathy. The esophagus has normal imaging features. There is
no axillary lymphadenopathy.

Lungs/Pleura: Patchy areas of ground-glass attenuation are seen in
the lungs bilaterally, nonspecific but multifocal
infectious/inflammatory etiology suspected. No pleural effusion.

Upper Abdomen: Unremarkable.

Musculoskeletal: No worrisome lytic or sclerotic osseous
abnormality.

Review of the MIP images confirms the above findings.
IMPRESSION: 1. No CT evidence for acute pulmonary embolus.
2. Patchy areas of ground-glass attenuation in the lungs
bilaterally, nonspecific and potentially related to air trapping but
multifocal infectious/inflammatory etiology not excluded.

## 2022-11-08 ENCOUNTER — Ambulatory Visit
Admission: EM | Admit: 2022-11-08 | Discharge: 2022-11-08 | Disposition: A | Discharge status: Home / Self Care | Payer: Commercial Managed Care - HMO

## 2022-11-08 DIAGNOSIS — R2231 Localized swelling, mass and lump, right upper limb: Secondary | ICD-10-CM

## 2022-11-08 DIAGNOSIS — T23161A Burn of first degree of back of right hand, initial encounter: Secondary | ICD-10-CM | POA: Diagnosis not present

## 2022-11-08 NOTE — Discharge Instructions (Signed)
Please go to the emergency department as soon as you leave urgent care for further evaluation and management. ?

## 2022-11-08 NOTE — ED Provider Notes (Signed)
[facility]    CSN: [id number] Arrival date & time: 11/08/22  1437      History   Chief Complaint Chief Complaint  Patient presents with   Burn    HPI LUVENA GRISSOM is a 42 y.o. female.   Patient presents with burn to right hand that occurred about 2 days ago.  Patient reports that she was smoking wax in a pipe when the glass broke and fell causing the melted wax to land on the top of her right hand.  She reports that she has had increased swelling over the past 24 hours.  Denies numbness or tingling but does report pain.  Patient is not sure of last tetanus vaccine.   Burn   Past Medical History:  Diagnosis Date   Anxiety    Anxiety    Chronic back pain     There are no problems to display for this patient.   Past Surgical History:  Procedure Laterality Date   WISDOM TOOTH EXTRACTION      OB History     Gravida  3   Para  1   Term      Preterm      AB      Living  1      SAB      IAB      Ectopic      Multiple      Live Births               Home Medications    Prior to Admission medications   Not on File    Family History History reviewed. No pertinent family history.  Social History Social History   Tobacco Use   Smoking status: Every Day    Packs/day: 1    Types: Cigarettes   Smokeless tobacco: Never  Substance Use Topics   Alcohol use: Yes    Comment: occ   Drug use: Yes    Types: Marijuana    Comment: in awhile     Allergies   Patient has no known allergies.   Review of Systems Review of Systems Per HPI  Physical Exam Triage Vital Signs ED Triage Vitals  Enc Vitals Group     BP 11/08/22 1625 124/72     Pulse Rate 11/08/22 1625 98     Resp 11/08/22 1625 16     Temp 11/08/22 1625 97.8 F (36.6 C)     Temp Source 11/08/22 1625 Oral     SpO2 11/08/22 1625 95 %     Weight --      Height --      Head Circumference --      Peak Flow --      Pain Score 11/08/22 1626 8     Pain Loc --       Pain Edu? --      Excl. in [place]? --    No data found.  Updated Vital Signs BP 124/72 (BP Location: Left Arm)   Pulse 98   Temp 97.8 F (36.6 C) (Oral)   Resp 16   SpO2 95%   Visual Acuity Right Eye Distance:   Left Eye Distance:   Bilateral Distance:    Right Eye Near:   Left Eye Near:    Bilateral Near:     Physical Exam Constitutional:      General: She is not in acute distress.    Appearance: Normal appearance. She is not toxic-appearing or diaphoretic.  HENT:  Head: Normocephalic and atraumatic.  Eyes:     Extraocular Movements: Extraocular movements intact.     Conjunctiva/sclera: Conjunctivae normal.  Pulmonary:     Effort: Pulmonary effort is normal.  Skin:    Comments: Patient has diffuse erythema throughout dorsal surface of right hand including fingers.  Moderate swelling included.  No areas of fluctuance or blisters.  There are several areas of broken skin throughout as well.  Patient has full range of motion of fingers.  Capillary refill and pulses intact.  Although, she has a ring present on her right thumb and right ring finger which are stuck due to swelling.  Neurological:     General: No focal deficit present.     Mental Status: She is alert and oriented to person, place, and time. Mental status is at baseline.  Psychiatric:        Mood and Affect: Mood normal.        Behavior: Behavior normal.        Thought Content: Thought content normal.        Judgment: Judgment normal.      UC Treatments / Results  Labs (all labs ordered are listed, but only abnormal results are displayed) Labs Reviewed - No data to display  EKG   Radiology No results found.  Procedures Procedures (including critical care time)  Medications Ordered in UC Medications - No data to display  Initial Impression / Assessment and Plan / UC Course  I have reviewed the triage vital signs and the nursing notes.  Pertinent labs & imaging results that were available  during my care of the patient were reviewed by me and considered in my medical decision making (see chart for details).     I am concerned about the amount of swelling located to right hand and patient's rings being stuck on her fingers.  I do think that patient needs a more extensive evaluation than can be provided here in urgent care so she was advised to go to the ER for further evaluation and management.  Rings will need to be removed as well given amount of swelling.  Vital signs and patient stable at discharge.  Agree with patient self transport to the ER.  Patient will need tetanus vaccine updated at the ER as well but given evaluation and management is deferred to ER, will defer tetanus vaccine to them as well.  Patient left via self transport. Final Clinical Impressions(s) / UC Diagnoses   Final diagnoses:  Superficial burn of back of right hand, initial encounter  Localized swelling on right hand     Discharge Instructions      Please go to the emergency department as soon as you leave urgent care for further evaluation and management.    ED Prescriptions   None    PDMP not reviewed this encounter.   Teodora Medici, [place] 11/08/22 [phone]

## 2022-11-08 NOTE — ED Triage Notes (Signed)
Pt c/o burn to right hand occurred ~ 2 days ago. Now is having appreciable edema to the area.

## 2022-11-16 ENCOUNTER — Ambulatory Visit
Admission: EM | Admit: 2022-11-16 | Discharge: 2022-11-16 | Disposition: A | Discharge status: Home / Self Care | Payer: Commercial Managed Care - HMO | Attending: Family Medicine | Admitting: Family Medicine

## 2022-11-16 ENCOUNTER — Ambulatory Visit
Admission: EM | Admit: 2022-11-16 | Discharge: 2022-11-16 | Discharge status: Against Medical Advice | Payer: Commercial Managed Care - HMO | Attending: Family Medicine | Admitting: Family Medicine

## 2022-11-16 ENCOUNTER — Encounter: Payer: Self-pay | Admitting: Emergency Medicine

## 2022-11-16 ENCOUNTER — Other Ambulatory Visit: Payer: Self-pay

## 2022-11-16 DIAGNOSIS — L02519 Cutaneous abscess of unspecified hand: Secondary | ICD-10-CM | POA: Diagnosis not present

## 2022-11-16 MED ORDER — AMOXICILLIN-POT CLAVULANATE 875-125 MG PO TABS: 1.0000 | ORAL_TABLET | Freq: Two times a day (BID) | ORAL | 0 refills | Status: AC

## 2022-11-16 MED ORDER — MUPIROCIN 2 % EX OINT: 1.0000 | TOPICAL_OINTMENT | Freq: Two times a day (BID) | CUTANEOUS | 0 refills | Status: AC

## 2022-11-16 MED ORDER — KETOROLAC TROMETHAMINE 30 MG/ML IJ SOLN
30.0000 mg | Freq: Once | INTRAMUSCULAR | Status: AC
  Administered 2022-11-16: 30 mg via INTRAMUSCULAR

## 2022-11-16 MED ORDER — IBUPROFEN 800 MG PO TABS: 800.0000 mg | ORAL_TABLET | Freq: Three times a day (TID) | ORAL | 0 refills | Status: AC | PRN

## 2022-11-16 NOTE — Discharge Instructions (Signed)
You have been given a shot of Toradol 30 mg today.  Take amoxicillin-clavulanate 875 mg--1 tab twice daily with food for 7 days  Put mupirocin ointment on the sore areas twice daily until improved  Take ibuprofen 800 mg--1 tab every 8 hours as needed for pain.  Clean the wound 2 times daily with peroxide or soapy water and then put new antibiotic ointment and bandage

## 2022-11-16 NOTE — ED Notes (Signed)
Spoke to pt on phone and she left

## 2022-11-16 NOTE — ED Provider Notes (Signed)
[facility]    CSN: [id number] Arrival date & time: 11/16/22  1423      History   Chief Complaint Chief Complaint  Patient presents with   Abscess    HPI Alexandra Johnson is a 42 y.o. female.    Abscess  Here for pain and swelling of her right hand.  It for started bothering her 2 days ago and has been enlarging since it began.  Maybe some fever today.  About 1 week ago she on her hand but it this further toward and that is healed.  Last menstrual cycle was about 2 weeks ago.    Past Medical History:  Diagnosis Date   Anxiety    Anxiety    Chronic back pain     There are no problems to display for this patient.   Past Surgical History:  Procedure Laterality Date   WISDOM TOOTH EXTRACTION      OB History     Gravida  3   Para  1   Term      Preterm      AB      Living  1      SAB      IAB      Ectopic      Multiple      Live Births               Home Medications    Prior to Admission medications   Medication Sig Start Date End Date Taking? Authorizing Provider  amoxicillin-clavulanate (AUGMENTIN) 875-125 MG tablet Take 1 tablet by mouth 2 (two) times daily for 7 days. 11/16/22 11/23/22 Yes Zenia Resides, MD  ibuprofen (ADVIL) 800 MG tablet Take 1 tablet (800 mg total) by mouth every 8 (eight) hours as needed (pain). 11/16/22  Yes Zenia Resides, MD  mupirocin ointment (BACTROBAN) 2 % Apply 1 Application topically 2 (two) times daily. To affected area till better 11/16/22  Yes Ilias Stcharles, Janace Aris, MD    Family History History reviewed. No pertinent family history.  Social History Social History   Tobacco Use   Smoking status: Every Day    Packs/day: 1    Types: Cigarettes   Smokeless tobacco: Never  Substance Use Topics   Alcohol use: Yes    Comment: occ   Drug use: Yes    Types: Marijuana    Comment: in awhile     Allergies   Patient has no known allergies.   Review of Systems Review of  Systems   Physical Exam Triage Vital Signs ED Triage Vitals  Enc Vitals Group     BP 11/16/22 1534 113/77     Pulse Rate 11/16/22 1534 87     Resp 11/16/22 1534 16     Temp 11/16/22 1534 97.7 F (36.5 C)     Temp Source 11/16/22 1534 Oral     SpO2 11/16/22 1534 98 %     Weight --      Height --      Head Circumference --      Peak Flow --      Pain Score 11/16/22 1538 10     Pain Loc --      Pain Edu? --      Excl. in [place]? --    No data found.  Updated Vital Signs BP 113/77 (BP Location: Left Arm)   Pulse 87   Temp 97.7 F (36.5 C) (Oral)   Resp 16  SpO2 98%   Visual Acuity Right Eye Distance:   Left Eye Distance:   Bilateral Distance:    Right Eye Near:   Left Eye Near:    Bilateral Near:     Physical Exam Vitals reviewed.  Constitutional:      General: She is not in acute distress.    Appearance: She is not ill-appearing, toxic-appearing or diaphoretic.  Skin:    Coloration: Skin is not jaundiced or pale.     Comments: And induration and tenderness on the dorsum of her right hand.  Induration and tenderness extends about 6 cm in diameter.  On the proximal part of that area there is an area of fluctuance about 2 cm in diameter.   Neurological:     Mental Status: She is alert and oriented to person, place, and time.  Psychiatric:        Behavior: Behavior normal.      UC Treatments / Results  Labs (all labs ordered are listed, but only abnormal results are displayed) Labs Reviewed - No data to display  EKG   Radiology No results found.  Procedures Procedures (including critical care time)  Medications Ordered in UC Medications  ketorolac (TORADOL) 30 MG/ML injection 30 mg (has no administration in time range)    Initial Impression / Assessment and Plan / UC Course  I have reviewed the triage vital signs and the nursing notes.  Pertinent labs & imaging results that were available during my care of the patient were reviewed by me and  considered in my medical decision making (see chart for details).        After verbal consent is obtained, 1% lidocaine is used to inject anesthesia into the roof of the abscess wall.  Under clean conditions and #11 blade is used to incise the roof of the abscess, and approximately 3 mL of bloody purulent material was obtained.  Bandages applied and wound care is explained.  EBL about to mL and no complications.  Augmentin is sent in for oral antibiotic and mupirocin is also sent in.  Toradol is given here for pain Final Clinical Impressions(s) / UC Diagnoses   Final diagnoses:  Abscess, hand     Discharge Instructions      You have been given a shot of Toradol 30 mg today.  Take amoxicillin-clavulanate 875 mg--1 tab twice daily with food for 7 days  Put mupirocin ointment on the sore areas twice daily until improved  Take ibuprofen 800 mg--1 tab every 8 hours as needed for pain.  Clean the wound 2 times daily with peroxide or soapy water and then put new antibiotic ointment and bandage     ED Prescriptions     Medication Sig Dispense Auth. Provider   amoxicillin-clavulanate (AUGMENTIN) 875-125 MG tablet Take 1 tablet by mouth 2 (two) times daily for 7 days. 14 tablet Vencil Basnett, Janace Aris, MD   ibuprofen (ADVIL) 800 MG tablet Take 1 tablet (800 mg total) by mouth every 8 (eight) hours as needed (pain). 21 tablet Katharin Schneider, Janace Aris, MD   mupirocin ointment (BACTROBAN) 2 % Apply 1 Application topically 2 (two) times daily. To affected area till better 22 g Marlinda Mike Janace Aris, MD      PDMP not reviewed this encounter.   Zenia Resides, MD 11/16/22 [phone]

## 2022-11-16 NOTE — ED Triage Notes (Signed)
Pt here for abscess to right hand with redness and swelling; pt was seen here for burn to same hand one week ago

## 2023-03-29 ENCOUNTER — Other Ambulatory Visit: Payer: Self-pay

## 2023-03-29 ENCOUNTER — Encounter (HOSPITAL_BASED_OUTPATIENT_CLINIC_OR_DEPARTMENT_OTHER): Payer: Self-pay | Admitting: Urology

## 2023-03-29 ENCOUNTER — Emergency Department (HOSPITAL_BASED_OUTPATIENT_CLINIC_OR_DEPARTMENT_OTHER): Payer: Commercial Managed Care - HMO

## 2023-03-29 ENCOUNTER — Emergency Department (HOSPITAL_BASED_OUTPATIENT_CLINIC_OR_DEPARTMENT_OTHER)
Admission: EM | Admit: 2023-03-29 | Discharge: 2023-03-29 | Disposition: A | Discharge status: Home / Self Care | Payer: Commercial Managed Care - HMO

## 2023-03-29 DIAGNOSIS — G8911 Acute pain due to trauma: Secondary | ICD-10-CM | POA: Diagnosis not present

## 2023-03-29 DIAGNOSIS — X500XXA Overexertion from strenuous movement or load, initial encounter: Secondary | ICD-10-CM | POA: Diagnosis not present

## 2023-03-29 DIAGNOSIS — M545 Low back pain, unspecified: Secondary | ICD-10-CM | POA: Insufficient documentation

## 2023-03-29 DIAGNOSIS — Y990 Civilian activity done for income or pay: Secondary | ICD-10-CM | POA: Diagnosis not present

## 2023-03-29 LAB — URINALYSIS, MICROSCOPIC (REFLEX)

## 2023-03-29 LAB — URINALYSIS, ROUTINE W REFLEX MICROSCOPIC
Bilirubin Urine: NEGATIVE
Glucose, UA: NEGATIVE mg/dL
Ketones, ur: NEGATIVE mg/dL
Leukocytes,Ua: NEGATIVE
Nitrite: NEGATIVE
Protein, ur: 100 mg/dL — AB
Specific Gravity, Urine: >=1.03 (ref 1.005–1.030)
pH: 5.5 (ref 5.0–8.0)

## 2023-03-29 LAB — PREGNANCY, URINE: Preg Test, Ur: NEGATIVE

## 2023-03-29 MED ORDER — PREDNISONE 20 MG PO TABS: ORAL_TABLET | ORAL | 0 refills | Status: AC

## 2023-03-29 MED ORDER — MELOXICAM 7.5 MG PO TABS: 7.5000 mg | ORAL_TABLET | Freq: Every day | ORAL | 0 refills | Status: AC

## 2023-03-29 MED ORDER — METHOCARBAMOL 500 MG PO TABS: 1000.0000 mg | ORAL_TABLET | Freq: Three times a day (TID) | ORAL | 0 refills | Status: AC | PRN

## 2023-03-29 NOTE — ED Provider Notes (Signed)
[facility] Provider Note   CSN: [id number] Arrival date & time: 03/29/23  1145     History  Chief Complaint  Patient presents with   Back Pain    Alexandra Johnson is a 42 y.o. female.  Patient presents to the emergency department today for evaluation of progressive lower back pain with radiation into the bilateral buttocks and into the posterior thighs.  She works at [employer] and does a lot of heavy lifting.  No history of lower back problems or back surgeries.  Initially she was taking Tylenol which helped, however this has been less effective and is starting to interfere with her job.  No neck pain but she states occasional tingling in her fingertips, especially when she wakes up in the morning. Patient denies warning symptoms of back pain including: fecal incontinence, urinary retention or overflow incontinence, night sweats, waking from sleep with back pain, unexplained fevers or weight loss, h/o cancer, IVDU, recent trauma.          Home Medications Prior to Admission medications   Medication Sig Start Date End Date Taking? Authorizing Provider  ibuprofen (ADVIL) 800 MG tablet Take 1 tablet (800 mg total) by mouth every 8 (eight) hours as needed (pain). 11/16/22   Zenia Resides, MD  mupirocin ointment (BACTROBAN) 2 % Apply 1 Application topically 2 (two) times daily. To affected area till better 11/16/22   Zenia Resides, MD      Allergies    Patient has no known allergies.    Review of Systems   Review of Systems  Physical Exam Updated Vital Signs BP (!) 153/104 (BP Location: Left Arm)   Pulse (!) 119   Temp (!) 97.2 F (36.2 C)   Resp 18   Ht 5\' 3"  (1.6 m)   Wt 81.6 kg   SpO2 100%   BMI 31.87 kg/m  Physical Exam Vitals and nursing note reviewed.  Constitutional:      Appearance: She is well-developed.  HENT:     Head: Normocephalic and atraumatic.  Eyes:     Conjunctiva/sclera: Conjunctivae normal.   Pulmonary:     Effort: Pulmonary effort is normal.  Abdominal:     Palpations: Abdomen is soft.     Tenderness: There is no abdominal tenderness.  Musculoskeletal:        General: Normal range of motion.     Cervical back: Normal range of motion and neck supple.     Comments: No step-off noted with palpation of spine.   Skin:    General: Skin is warm and dry.     Findings: No rash.  Neurological:     Mental Status: She is alert.     Sensory: No sensory deficit.     Gait: Gait normal.     Comments: 5/5 strength in entire lower extremities bilaterally. No sensation deficit.  Patient has some discomfort walking but is able to stand and ambulate in the exam room.     ED Results / Procedures / Treatments   Labs (all labs ordered are listed, but only abnormal results are displayed) Labs Reviewed  URINALYSIS, ROUTINE W REFLEX MICROSCOPIC - Abnormal; Notable for the following components:      Result Value   Hgb urine dipstick TRACE (*)    Protein, ur 100 (*)    All other components within normal limits  URINALYSIS, MICROSCOPIC (REFLEX) - Abnormal; Notable for the following components:   Bacteria, UA RARE (*)  All other components within normal limits  PREGNANCY, URINE    EKG None  Radiology DG Lumbar Spine Complete  Result Date: 03/29/2023 CLINICAL DATA:  back pain EXAM: LUMBAR SPINE - COMPLETE 4 VIEW COMPARISON:  Lumbar spine radiograph 10/02/19 FINDINGS: Five lumbar-type vertebral bodies. Vertebral body heights are maintained. Disc spaces are preserved. No significant degenerative change. No pars defect. Symmetric SI joints. Assessment of the sacrum is limited due to overlying bowel gas. Nonobstructive bowel gas pattern. Mild colonic stool burden. Likely small left upper pole renal stone. IMPRESSION: 1. No acute osseous abnormality. 2. No significant degenerative change. 3. Likely small left upper pole renal stone. Electronically Signed   By: Lorenza Cambridge M.D.   On: 03/29/2023  15:05    Procedures Procedures    Medications Ordered in ED Medications - No data to display  ED Course/ Medical Decision Making/ A&P    Patient seen and examined. History obtained directly from patient. Work-up including labs, imaging, EKG ordered in triage, if performed, were reviewed.    Labs/EKG: Independently reviewed and interpreted.  This included: Urinalysis without compelling signs of infection, pregnancy negative.  Imaging: Lumbar spine films ordered in triage, pending.  Medications/Fluids: None ordered  Most recent vital signs reviewed and are as follows: BP (!) 153/104 (BP Location: Left Arm)   Pulse (!) 119   Temp (!) 97.2 F (36.2 C)   Resp 18   Ht 5\' 3"  (1.6 m)   Wt 81.6 kg   SpO2 100%   BMI 31.87 kg/m   Initial impression: Low back pain with radiation into the bilateral lower extremities.  Awaiting plain films.  Will consider trial of prednisone, muscle relaxer, NSAIDs.  3:26 PM Reassessment performed. Patient appears stable.  Imaging personally visualized and interpreted including: X-ray of the lumbar spine, agree no acute fractures  Reviewed pertinent lab work and imaging with patient at bedside. Questions answered.   Most current vital signs reviewed and are as follows: BP (!) 153/104 (BP Location: Left Arm)   Pulse (!) 119   Temp (!) 97.2 F (36.2 C)   Resp 18   Ht 5\' 3"  (1.6 m)   Wt 81.6 kg   SpO2 100%   BMI 31.87 kg/m   Plan: Discharge to home.   Home treatment plan: Patient was counseled on back pain precautions and advised to do activity as tolerated but avoid strenuous activity and do not lift, push, or pull heavy objects more than 10 pounds for the next week.  Patient counseled to use ice or heat on back as needed for pain and spasm.    Medications prescribed: Prednisone, meloxicam, Robaxin for muscle pain and spasm. Patient counseled on proper use of muscle relaxant medication.  They were requested not to drink alcohol, drive any  vehicle, or do any dangerous activities while taking this medication due to potential drowsiness and unintended.  Patient verbalized understanding.  Return instructions discussed with patient: Urged to return with worsening severe pain, loss of bowel or bladder control, trouble walking, development of weakness in the legs, or with any other concerns.  Follow-up instructions discussed with patient: Patient urged to follow-up with PCP if pain does not improve with treatment and rest or if pain becomes recurrent.                                    Medical Decision Making Amount and/or Complexity of Data Reviewed Labs:  ordered. Radiology: ordered.   Patient with back pain, radicular features.  Also with features concerning for spinal stenosis.  No neurological deficits at time of exam. Patient is ambulatory. No warning symptoms of back pain including: fecal incontinence, urinary retention or overflow incontinence, night sweats, waking from sleep with back pain, unexplained fevers or weight loss, h/o cancer, IVDU, recent trauma. No concern for cauda equina, epidural abscess, or other serious cause of back pain. Conservative measures such as rest, ice/heat and pain medicine indicated with PCP follow-up if no improvement with conservative management.          Final Clinical Impression(s) / ED Diagnoses Final diagnoses:  Acute midline low back pain without sciatica    Rx / DC Orders ED Discharge Orders          Ordered    predniSONE (DELTASONE) 20 MG tablet        03/29/23 1516    methocarbamol (ROBAXIN) 500 MG tablet  Every 8 hours PRN        03/29/23 1516    meloxicam (MOBIC) 7.5 MG tablet  Daily        03/29/23 1516              Renne Crigler, PA-C 03/29/23 1529    Coral Spikes, DO 03/30/23 [phone]

## 2023-03-29 NOTE — Discharge Instructions (Signed)
Please read and follow all provided instructions.  Your diagnoses today include:  1. Acute midline low back pain without sciatica     Tests performed today include: Vital signs - see below for your results today X-ray of the lumbar spine was negative  Medications prescribed:  Prednisone - steroid medicine   It is best to take this medication in the morning to prevent sleeping problems. If you are diabetic, monitor your blood sugar closely and stop taking Prednisone if blood sugar is over 300. Take with food to prevent stomach upset.   Robaxin (methocarbamol) - muscle relaxer medication  DO NOT drive or perform any activities that require you to be awake and alert because this medicine can make you drowsy.   Meloxicam - anti-inflammatory pain medication  You have been prescribed an anti-inflammatory medication or NSAID. Take with food. Do not take aspirin, ibuprofen, or naproxen if taking this medication. Take smallest effective dose for the shortest duration needed for your pain. Stop taking if you experience stomach pain or vomiting.   Take any prescribed medications only as directed.  Home care instructions:  Follow any educational materials contained in this packet Please rest, use ice or heat on your back for the next several days Do not lift, push, pull anything more than 10 pounds for the next week  Follow-up instructions: Please follow-up with your primary care provider in the next 1 week for further evaluation of your symptoms.   Return instructions:  SEEK IMMEDIATE MEDICAL ATTENTION IF YOU HAVE: New numbness, tingling, weakness, or problem with the use of your arms or legs Severe back pain not relieved with medications Loss control of your bowels or bladder Increasing pain in any areas of the body (such as chest or abdominal pain) Shortness of breath, dizziness, or fainting.  Worsening nausea (feeling sick to your stomach), vomiting, fever, or sweats Any other  emergent concerns regarding your health   Additional Information:  Your vital signs today were: BP (!) 153/104 (BP Location: Left Arm)   Pulse (!) 119   Temp (!) 97.2 F (36.2 C)   Resp 18   Ht 5\' 3"  (1.6 m)   Wt 81.6 kg   SpO2 100%   BMI 31.87 kg/m  If your blood pressure (BP) was elevated above 135/85 this visit, please have this repeated by your doctor within one month. --------------

## 2023-03-29 NOTE — ED Triage Notes (Signed)
Pt states lower back pian that radiates down legs x 2-3 months Heavy lifting at work
